# Patient Record
Sex: Male | Born: 1955 | Race: Black or African American | Hispanic: No | Marital: Married | Smoking: Never smoker
Health system: Southern US, Community
[De-identification: ages and names within clinical notes are randomized; demographics above are authoritative.]

## PROBLEM LIST (undated history)

## (undated) DIAGNOSIS — I10 Essential (primary) hypertension: Secondary | ICD-10-CM

## (undated) DIAGNOSIS — E119 Type 2 diabetes mellitus without complications: Secondary | ICD-10-CM

## (undated) DIAGNOSIS — I639 Cerebral infarction, unspecified: Secondary | ICD-10-CM

## (undated) DIAGNOSIS — IMO0001 Reserved for inherently not codable concepts without codable children: Secondary | ICD-10-CM

## (undated) DIAGNOSIS — I499 Cardiac arrhythmia, unspecified: Secondary | ICD-10-CM

## (undated) DIAGNOSIS — R112 Nausea with vomiting, unspecified: Secondary | ICD-10-CM

## (undated) DIAGNOSIS — Z531 Procedure and treatment not carried out because of patient's decision for reasons of belief and group pressure: Secondary | ICD-10-CM

## (undated) DIAGNOSIS — T8859XA Other complications of anesthesia, initial encounter: Secondary | ICD-10-CM

## (undated) DIAGNOSIS — T4145XA Adverse effect of unspecified anesthetic, initial encounter: Secondary | ICD-10-CM

## (undated) DIAGNOSIS — M1990 Unspecified osteoarthritis, unspecified site: Secondary | ICD-10-CM

## (undated) DIAGNOSIS — G4730 Sleep apnea, unspecified: Secondary | ICD-10-CM

## (undated) DIAGNOSIS — K219 Gastro-esophageal reflux disease without esophagitis: Secondary | ICD-10-CM

## (undated) DIAGNOSIS — Z9889 Other specified postprocedural states: Secondary | ICD-10-CM

## (undated) HISTORY — PX: BACK SURGERY: SHX140

---

## 1999-03-26 ENCOUNTER — Ambulatory Visit (HOSPITAL_COMMUNITY): Admission: RE | Admit: 1999-03-26 | Discharge: 1999-03-26 | Payer: Self-pay | Admitting: Orthopaedic Surgery

## 1999-04-23 ENCOUNTER — Ambulatory Visit (HOSPITAL_COMMUNITY): Admission: RE | Admit: 1999-04-23 | Discharge: 1999-04-24 | Payer: Self-pay | Admitting: Orthopaedic Surgery

## 1999-08-11 ENCOUNTER — Encounter: Admission: RE | Admit: 1999-08-11 | Discharge: 1999-11-09 | Payer: Self-pay | Admitting: Family Medicine

## 2000-01-30 ENCOUNTER — Encounter: Admission: RE | Admit: 2000-01-30 | Discharge: 2000-01-30 | Payer: Self-pay | Admitting: Family Medicine

## 2000-01-30 ENCOUNTER — Encounter: Payer: Self-pay | Admitting: Family Medicine

## 2000-02-17 ENCOUNTER — Ambulatory Visit (HOSPITAL_COMMUNITY): Admission: RE | Admit: 2000-02-17 | Discharge: 2000-02-17 | Payer: Self-pay | Admitting: Orthopaedic Surgery

## 2000-03-04 ENCOUNTER — Ambulatory Visit (HOSPITAL_COMMUNITY): Admission: RE | Admit: 2000-03-04 | Discharge: 2000-03-04 | Payer: Self-pay | Admitting: Orthopaedic Surgery

## 2000-03-19 ENCOUNTER — Ambulatory Visit (HOSPITAL_COMMUNITY): Admission: RE | Admit: 2000-03-19 | Discharge: 2000-03-19 | Payer: Self-pay | Admitting: Orthopaedic Surgery

## 2000-06-28 ENCOUNTER — Ambulatory Visit (HOSPITAL_COMMUNITY): Admission: RE | Admit: 2000-06-28 | Discharge: 2000-06-28 | Payer: Self-pay | Admitting: Neurosurgery

## 2000-06-28 ENCOUNTER — Encounter: Payer: Self-pay | Admitting: Neurosurgery

## 2000-07-13 ENCOUNTER — Encounter: Payer: Self-pay | Admitting: Neurosurgery

## 2000-07-15 ENCOUNTER — Encounter: Payer: Self-pay | Admitting: Neurosurgery

## 2000-07-15 ENCOUNTER — Inpatient Hospital Stay (HOSPITAL_COMMUNITY): Admission: RE | Admit: 2000-07-15 | Discharge: 2000-07-17 | Payer: Self-pay | Admitting: Neurosurgery

## 2001-08-01 ENCOUNTER — Encounter: Admission: RE | Admit: 2001-08-01 | Discharge: 2001-08-01 | Payer: Self-pay | Admitting: Neurosurgery

## 2001-08-01 ENCOUNTER — Encounter: Payer: Self-pay | Admitting: Neurosurgery

## 2001-10-07 ENCOUNTER — Encounter: Payer: Self-pay | Admitting: Neurosurgery

## 2001-10-11 ENCOUNTER — Encounter: Payer: Self-pay | Admitting: Neurosurgery

## 2001-10-11 ENCOUNTER — Inpatient Hospital Stay (HOSPITAL_COMMUNITY): Admission: RE | Admit: 2001-10-11 | Discharge: 2001-10-15 | Payer: Self-pay | Admitting: Neurosurgery

## 2002-09-28 ENCOUNTER — Ambulatory Visit (HOSPITAL_COMMUNITY): Admission: RE | Admit: 2002-09-28 | Discharge: 2002-09-28 | Payer: Self-pay | Admitting: *Deleted

## 2005-11-05 ENCOUNTER — Ambulatory Visit: Payer: Self-pay | Admitting: Family Medicine

## 2005-11-10 ENCOUNTER — Ambulatory Visit: Payer: Self-pay | Admitting: Family Medicine

## 2005-11-29 ENCOUNTER — Encounter: Admission: RE | Admit: 2005-11-29 | Discharge: 2005-11-29 | Payer: Self-pay | Admitting: Orthopedic Surgery

## 2006-02-02 ENCOUNTER — Emergency Department (HOSPITAL_COMMUNITY): Admission: EM | Admit: 2006-02-02 | Discharge: 2006-02-02 | Payer: Self-pay | Admitting: Emergency Medicine

## 2006-12-03 ENCOUNTER — Ambulatory Visit: Admission: RE | Admit: 2006-12-03 | Discharge: 2006-12-03 | Payer: Self-pay | Admitting: Neurosurgery

## 2007-03-11 ENCOUNTER — Ambulatory Visit: Payer: Self-pay | Admitting: Pulmonary Disease

## 2007-03-11 ENCOUNTER — Ambulatory Visit: Payer: Self-pay | Admitting: Oncology

## 2007-03-11 ENCOUNTER — Inpatient Hospital Stay (HOSPITAL_COMMUNITY): Admission: RE | Admit: 2007-03-11 | Discharge: 2007-03-20 | Payer: Self-pay | Admitting: Neurosurgery

## 2007-03-15 ENCOUNTER — Ambulatory Visit: Payer: Self-pay | Admitting: Surgery

## 2007-03-16 ENCOUNTER — Encounter (INDEPENDENT_AMBULATORY_CARE_PROVIDER_SITE_OTHER): Payer: Self-pay | Admitting: Cardiovascular Disease

## 2007-03-22 ENCOUNTER — Ambulatory Visit: Payer: Self-pay | Admitting: Oncology

## 2007-03-27 ENCOUNTER — Emergency Department (HOSPITAL_COMMUNITY): Admission: EM | Admit: 2007-03-27 | Discharge: 2007-03-27 | Payer: Self-pay | Admitting: Emergency Medicine

## 2008-05-06 ENCOUNTER — Encounter: Admission: RE | Admit: 2008-05-06 | Discharge: 2008-05-06 | Payer: Self-pay | Admitting: Internal Medicine

## 2008-05-25 HISTORY — PX: JOINT REPLACEMENT: SHX530

## 2008-06-18 ENCOUNTER — Inpatient Hospital Stay (HOSPITAL_COMMUNITY): Admission: RE | Admit: 2008-06-18 | Discharge: 2008-06-22 | Payer: Self-pay | Admitting: Orthopaedic Surgery

## 2008-08-05 IMAGING — CR DG CHEST 1V PORT
1 series · 1 of 1 positions shown · non-contrast
Comparison: 03/13/07.

CLINICAL DATA: Shortness of breath.  
 PORTABLE CHEST ? 1 VIEW:

[view not recorded]
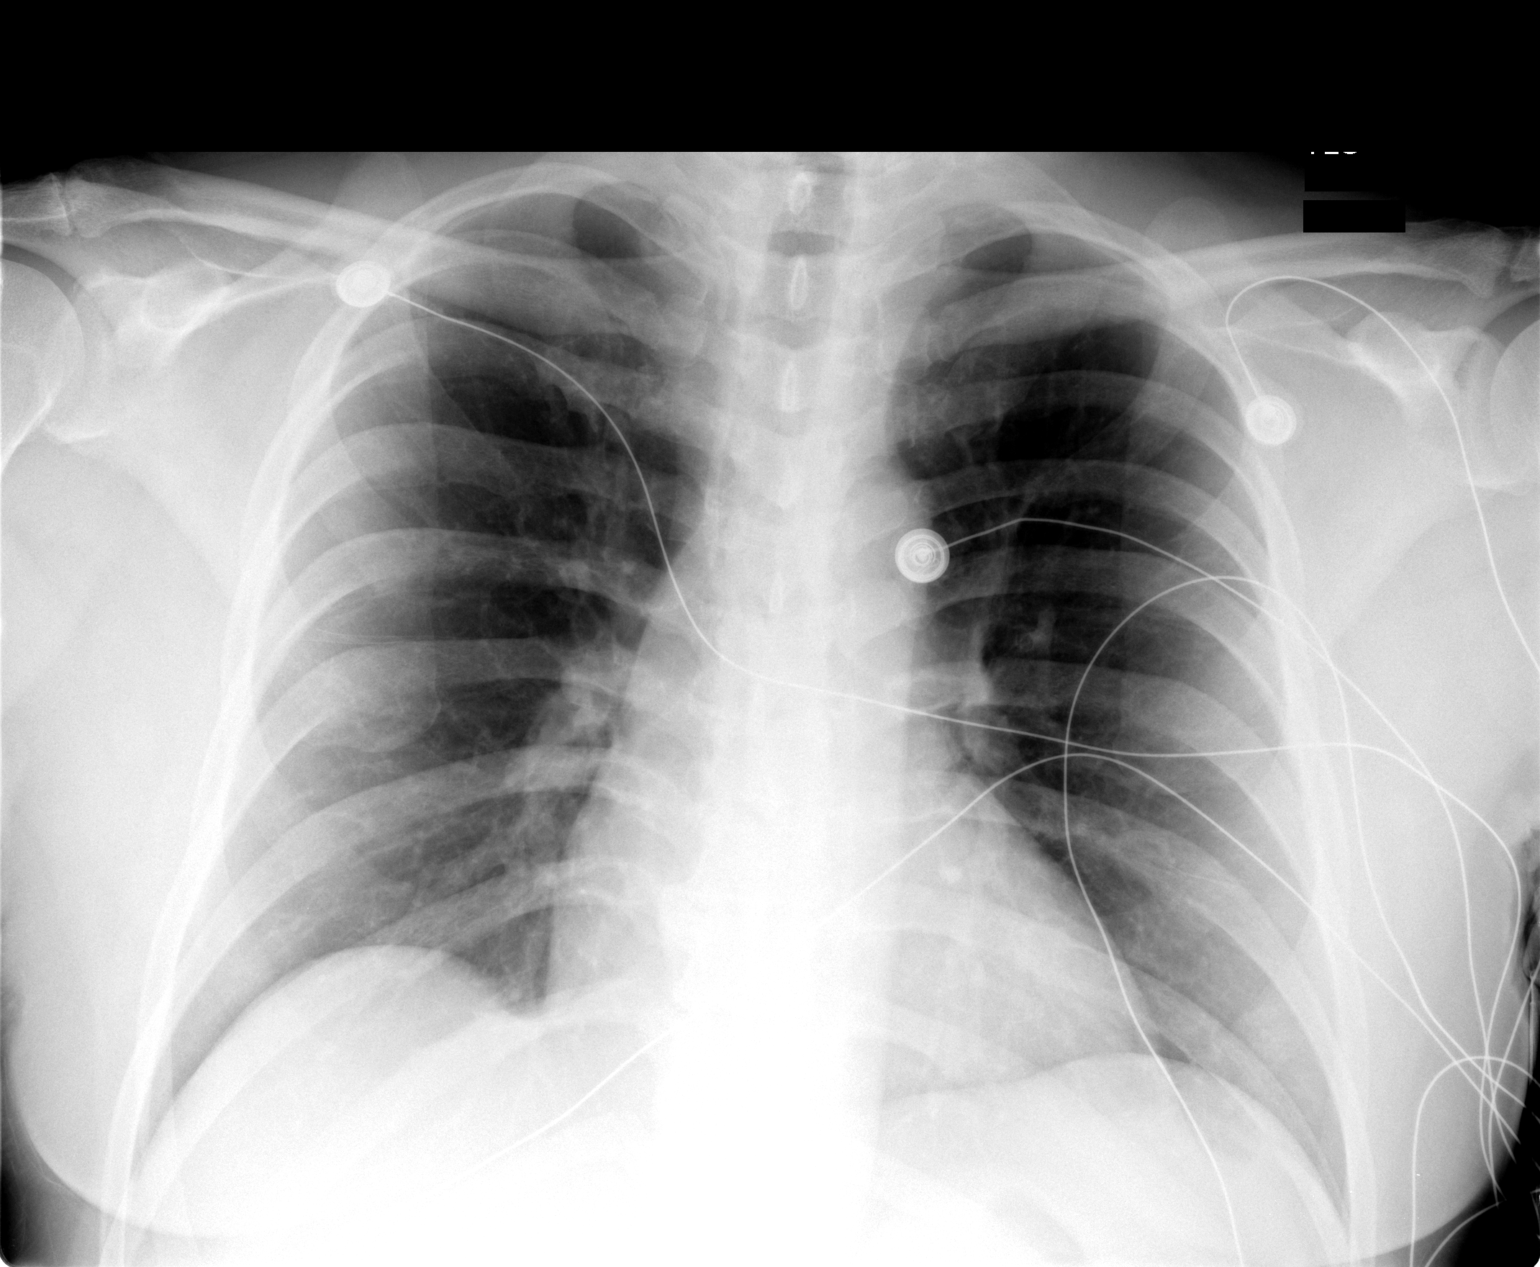

[1 of 1 positions shown; findings below may reference images not displayed]

FINDINGS: Previously seen bibasilar atelectasis has resolved.  Both lungs are well expanded and clear.  The heart and mediastinal contours are within normal limits.
IMPRESSION: No evidence of acute cardiopulmonary disease.

## 2009-10-15 ENCOUNTER — Encounter: Admission: RE | Admit: 2009-10-15 | Discharge: 2009-10-15 | Payer: Self-pay | Admitting: Neurosurgery

## 2010-09-08 LAB — BASIC METABOLIC PANEL
BUN: 12 mg/dL (ref 6–23)
BUN: 6 mg/dL (ref 6–23)
BUN: 8 mg/dL (ref 6–23)
CO2: 26 mEq/L (ref 19–32)
Calcium: 8.7 mg/dL (ref 8.4–10.5)
Calcium: 8.7 mg/dL (ref 8.4–10.5)
Calcium: 8.7 mg/dL (ref 8.4–10.5)
Creatinine, Ser: 0.96 mg/dL (ref 0.4–1.5)
GFR calc Af Amer: >60 mL/min (ref >60)
GFR calc non Af Amer: >60 mL/min (ref >60)
GFR calc non Af Amer: >60 mL/min (ref >60)
Glucose, Bld: 134 mg/dL — ABNORMAL HIGH (ref 70–99)
Glucose, Bld: 177 mg/dL — ABNORMAL HIGH (ref 70–99)
Potassium: 4.2 mEq/L (ref 3.5–5.1)
Sodium: 127 mEq/L — ABNORMAL LOW (ref 135–145)
Sodium: 133 mEq/L — ABNORMAL LOW (ref 135–145)
Sodium: 133 mEq/L — ABNORMAL LOW (ref 135–145)

## 2010-09-08 LAB — GLUCOSE, CAPILLARY
Glucose-Capillary: 101 mg/dL — ABNORMAL HIGH (ref 70–99)
Glucose-Capillary: 143 mg/dL — ABNORMAL HIGH (ref 70–99)
Glucose-Capillary: 146 mg/dL — ABNORMAL HIGH (ref 70–99)
Glucose-Capillary: 152 mg/dL — ABNORMAL HIGH (ref 70–99)
Glucose-Capillary: 165 mg/dL — ABNORMAL HIGH (ref 70–99)
Glucose-Capillary: 169 mg/dL — ABNORMAL HIGH (ref 70–99)
Glucose-Capillary: 216 mg/dL — ABNORMAL HIGH (ref 70–99)
Glucose-Capillary: 287 mg/dL — ABNORMAL HIGH (ref 70–99)

## 2010-09-08 LAB — COMPREHENSIVE METABOLIC PANEL
ALT: 25 U/L (ref 0–53)
AST: 27 U/L (ref 0–37)
Albumin: 3.1 g/dL — ABNORMAL LOW (ref 3.5–5.2)
Alkaline Phosphatase: 44 U/L (ref 39–117)
Alkaline Phosphatase: 65 U/L (ref 39–117)
BUN: 10 mg/dL (ref 6–23)
BUN: 8 mg/dL (ref 6–23)
CO2: 27 mEq/L (ref 19–32)
CO2: 28 mEq/L (ref 19–32)
Calcium: 10.5 mg/dL (ref 8.4–10.5)
Chloride: 96 mEq/L (ref 96–112)
Creatinine, Ser: 0.92 mg/dL (ref 0.4–1.5)
GFR calc Af Amer: >60 mL/min (ref >60)
Glucose, Bld: 137 mg/dL — ABNORMAL HIGH (ref 70–99)
Potassium: 4.1 mEq/L (ref 3.5–5.1)
Potassium: 4.4 mEq/L (ref 3.5–5.1)
Total Bilirubin: 0.7 mg/dL (ref 0.3–1.2)
Total Bilirubin: 1.3 mg/dL — ABNORMAL HIGH (ref 0.3–1.2)

## 2010-09-08 LAB — CBC
HCT: 25.7 % — ABNORMAL LOW (ref 39.0–52.0)
HCT: 28.5 % — ABNORMAL LOW (ref 39.0–52.0)
HCT: 36.2 % — ABNORMAL LOW (ref 39.0–52.0)
Hemoglobin: 10.1 g/dL — ABNORMAL LOW (ref 13.0–17.0)
Hemoglobin: 8.5 g/dL — ABNORMAL LOW (ref 13.0–17.0)
Platelets: 189 10*3/uL (ref 150–400)
Platelets: 216 10*3/uL (ref 150–400)
Platelets: 222 10*3/uL (ref 150–400)
Platelets: 245 10*3/uL (ref 150–400)
Platelets: 247 10*3/uL (ref 150–400)
RDW: 13.6 % (ref 11.5–15.5)
RDW: 13.8 % (ref 11.5–15.5)
RDW: 14.1 % (ref 11.5–15.5)
WBC: 10.8 10*3/uL — ABNORMAL HIGH (ref 4.0–10.5)
WBC: 9.3 10*3/uL (ref 4.0–10.5)
WBC: 9.4 10*3/uL (ref 4.0–10.5)

## 2010-09-08 LAB — DIFFERENTIAL
Eosinophils Absolute: 0.2 10*3/uL (ref 0.0–0.7)
Eosinophils Relative: 3 % (ref 0–5)
Eosinophils Relative: 4 % (ref 0–5)
Lymphocytes Relative: 15 % (ref 12–46)
Lymphs Abs: 1.4 10*3/uL (ref 0.7–4.0)
Monocytes Relative: 9 % (ref 3–12)
Neutro Abs: 3.6 10*3/uL (ref 1.7–7.7)

## 2010-09-08 LAB — URINALYSIS, ROUTINE W REFLEX MICROSCOPIC
Hgb urine dipstick: NEGATIVE
Nitrite: NEGATIVE
Protein, ur: NEGATIVE mg/dL
Specific Gravity, Urine: 1.022 (ref 1.005–1.030)

## 2010-09-08 LAB — PROTIME-INR
INR: 1 (ref 0.00–1.49)
INR: 1.1 (ref 0.00–1.49)
INR: 1.4 (ref 0.00–1.49)
Prothrombin Time: 13.7 seconds (ref 11.6–15.2)
Prothrombin Time: 14.9 seconds (ref 11.6–15.2)
Prothrombin Time: 20.7 seconds — ABNORMAL HIGH (ref 11.6–15.2)

## 2010-09-08 LAB — CK TOTAL AND CKMB (NOT AT ARMC)
CK, MB: 6.2 ng/mL — ABNORMAL HIGH (ref 0.3–4.0)
Relative Index: 0.6 (ref 0.0–2.5)
Total CK: 1019 U/L — ABNORMAL HIGH (ref 7–232)

## 2010-09-08 LAB — NO BLOOD PRODUCTS

## 2010-09-08 LAB — TROPONIN I: Troponin I: <0.01 ng/mL (ref 0.00–0.06)

## 2010-09-08 LAB — MAGNESIUM: Magnesium: 2 mg/dL (ref 1.5–2.5)

## 2010-10-07 NOTE — Op Note (Signed)
NAMEREBEKAH, [name]                  ACCOUNT NO.:  [id number]   MEDICAL RECORD NO.:  [id number]          PATIENT TYPE:  INP   LOCATION:  5007                         FACILITY:  [facility]   PHYSICIAN:  Mark C. Ophelia Charter, M.D.    DATE OF BIRTH:  02-11-1956   DATE OF PROCEDURE:  06/18/2008  DATE OF DISCHARGE:                               OPERATIVE REPORT   PREOPERATIVE DIAGNOSIS:  Right knee osteoarthritis, tricompartmental.   POSTOPERATIVE DIAGNOSIS:  Right knee osteoarthritis, tricompartmental.   PROCEDURE:  Right cemented total knee arthroplasty-computer assist.   SURGEON:  Mark C. Ophelia Charter, MD   ASSISTANT:  Wende Neighbors, PA   ANESTHESIA:  GOT plus preoperative femoral nerve block and Marcaine and  local.   TOURNIQUET TIME:  An hour and 33 minutes x350.   COMPONENTS USED:  DePuy J and J rotating platform, femoral component #5  with lugs, cemented #4 tibia with 10-mm poly-spacer rotating platform.  A 38-mm all poly-patella cemented.   After the induction of general anesthesia, proximal thigh tourniquet,  heel bump, lateral post, surgical checklist was run and it was noted  that the vancomycin was not completely in.  Ten minutes additional time  with the patient asleep with the patient's history of anaphylactic-type  reaction with penicillin, it was decided to delay the skin incision  until 80% to 90% of the vancomycin was infused since the leg was wrapped  in an Esmarch, a tourniquet would be inflated.  When about 20% of this  was left, incision was made in the midline after a sterile skin marker,  Betadine, Vi-Drape, usual total hip sheets drapes had all been applied.  Incision was made in the midline.  Meticulous hemostasis was performed.  When the deep retinaculum was encountered, only about 10% of the vanco  was left and the leg was wrapped in Esmarch and tourniquet inflated.  A  medial retinacular incision was made.  Patella flipped over was cut,  removing 10 mm of bone.   Patella was particularly thick.  There were 1-  1.5-cm osteophytes on the femur as well as the tibia as well as the  patella which were trimmed back.  There was grade 3 and grade 4  chondromalacia with exposed subchondral bone in the weightbearing  portions of the joint.  Menisci were resected, ACL was resected, and  pins were placed for computer navigation.  Computer models were  generated for the tibia and the femur.  Computer suggested #5 size which  was appropriate on the femur and 10-mm bone was resected distally using  the computer navigation.  All cuts were less than 1.5 degrees from  __________ and initially, there was suggestion on visualization that the  femur was being cut in external rotation.  The patient did have  excessive hip retroversion and had both feet turned out to about 60-70  degrees in the supine position.  With the knee flexed, posterior cut  appeared to line up well with the tibia and the old anterior reference  and guide was inserted.  It was placed on #5 and then  checked the  measured, and then re-pinned and turned out that gave the identical  pinning as the computer navigation.  With this chamfer cuts, anterior  and posterior cuts were made.  The center notch cut was made after the  tibia had been cut.  Posterior spur was removed off the femur and the  PCL was completely resected.  On the tibia, pin was taken off the high  side, 5 off below.  On the femur, Whiteside's line was used for  referencing due to the extremely large spurs and some difficulty with  the thick, broadened epicondyles on this patient's femur.  The tibia was  #4.  Initial cuts were made and then with trials inserted, it was tight,  lacking 3 degrees, reaching full extension, felt too tight, checking  balancing and flexion/extension showed that lateral side was 1-mm  tighter.  Tibia was re-cut, taking off 2 more millimeters and this gave  full extension and actually 1 degree of  hyperextension.  There was a  stable balanced collateral ligaments in both flexion and extension.  The  trials were removed after drilling the lug holes in the femur and holes  in the patella.  Pulsatile lavage cement was vacuum mixed.  Tibia  inserted and cemented first.  Femur was then inserted and all excessive  cement from the back of the knee and the gutters were removed prior to  placing the femur.  It was impacted with a large impactor and mallet all  the way down.  A 10-mm permanent poly-spacer was inserted followed by  the patella with patellar clamp.  Cement was hardened for 15 minutes.  All excessive cement had been removed.  There was good stability.  Components were down.  Computer navigation pins were removed, pins in  the tibia  were closed.  The femoral holes were made inside the  incision.  After dropping the tourniquet, hemostasis was obtained.  Layer closure with 0 and #1 nonabsorbable in the deep layer, 2-0 in the  superficial retinaculum, subcutaneous tissue, and subcuticular skin  closure.  Tincture of Benzoin, Marcaine infiltration, Steri-Strips,  postop dressing, and knee immobilizer.  The patient tolerated the  procedure well.  Knee reached full extension, was bounced in both  flexion and extension, and there was good rotation and good patellar  tracking.      Mark C. Ophelia Charter, M.D.  Electronically Signed     MCY/MEDQ  D:  06/18/2008  T:  06/19/2008  Job:  [id number]

## 2010-10-07 NOTE — Discharge Summary (Signed)
NAMEDAMONI, CAUSBY                  ACCOUNT NO.:  [id number]   MEDICAL RECORD NO.:  [id number]          PATIENT TYPE:  INP   LOCATION:  3741                         FACILITY:  [facility]   PHYSICIAN:  Danae Orleans. Venetia Maxon, M.D.  DATE OF BIRTH:  15-Jul-1955   DATE OF ADMISSION:  03/11/2007  DATE OF DISCHARGE:                               DISCHARGE SUMMARY   REASON FOR ADMISSION:  Lumbar spondylosis L2-3 and L3-4 level.   HOSPITAL COURSE:  [name] is a 55 year old man who had previously  undergone fusion at L4-5 and L5-S1.  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  He had persistent back pain and over the last few  months, since August for a knee operation, had an increase in his back  pain and pain radiating down both of his legs, left greater than right.  An MRI showed degenerative changes to L2-3 and L3-4, and he was admitted  for fusion of those levels.  He underwent C compression and fusion at L2  to L4 levels and tolerated the procedure well.  His postoperative course  was complicated by the fact that postoperatively he had some back pain  and additionally had bloody drainage from his wound.  He was  tachycardic, and he will not receive blood products.  He had hematocrit  that came down to 24.6 and subsequently had a hemoglobin of 6.7.  He was  treated with intravenous iron and also Aranesp weekly.  He was seen by  Hematology, who felt that there was not much else that could be done,  and he was evaluated by Cardiology, who felt that his anemia would  resolve over time and that there was not much else that could be done,  short of giving him blood.  Echocardiogram was performed, and the  patient gradually improved, but his PAF was appropriate given severe  anemia.  The patient is otherwise gradually had improvement in his heart  rate and was gradually mobilized, doing better.  He also is a diabetic  and had control of his blood sugars.  He was doing better on the 26th  and  was discharged in stable and satisfactory condition, and he  tolerated his operation and hospitalization.   DISCHARGE MEDICATIONS:  Included preoperative medications of NPH insulin  10 units q.h.s., Glucophage 850 mg twice daily, glipizide 10 mg 1 every  day, HCTZ 25 mg every day, lisinopril 10 mg daily, quinidine patch 0.1  mg every  7 days, Tramadol 50 mg as needed, gabapentin 300 mg 3 times  daily.   DISCHARGE INSTRUCTIONS:  He was also given a prescription for Percocet  5/325 one to 2 every 4-6 hours as needed for pain.  He is instructed to  take it easy at home, to gradually mobilize.  Wear his brace when up.   FOLLOWUP:  Follow up with Dr. Channing Mutters in 2-3 weeks.      Danae Orleans. Venetia Maxon, M.D.  Electronically Signed     JDS/MEDQ  D:  03/20/2007  T:  03/20/2007  Job:  [id number]

## 2010-10-07 NOTE — Consult Note (Signed)
NAMECESARIO, [name]                  ACCOUNT NO.:  [id number]   MEDICAL RECORD NO.:  [id number]          PATIENT TYPE:  INP   LOCATION:  3741                         FACILITY:  [facility]   PHYSICIAN:  Marlowe Kays, P.A.     DATE OF BIRTH:  03/11/1956   DATE OF CONSULTATION:  03/18/2007  DATE OF DISCHARGE:  03/20/2007                                 CONSULTATION   REFERRING PHYSICIAN:  Danae Orleans. Venetia Maxon, M.D.   CONSULTANT:  Dr. Douglass Rivers.   REASON FOR CONSULTATION:  Anemia in Jehovah's Witness patient.   HISTORY OF PRESENT ILLNESS:  We were asked to see Mr. Cho, a 55-year-  old African-American male, Jehovah's witness by Dr. Venetia Maxon for evaluation  of anemia.  The patient was previously well with no major medical  issues, but after a lumbar laminectomy of L3-L4 and L4-L5 on March 11, 2007, it was noticed that he developed significant anemia.  His  hemoglobin and hematocrit on March 07, 2007, were of 14.2 and 43  respectively.  Op note indicates that his estimated blood loss was 450  mL.  Postoperative course was complicated by paroxysmal atrial  fibrillation and a dramatic drop in his hemoglobin and hematocrit.  The  platelets dropped as well indicative of major blood loss. On March 17, 2007, his hemoglobin was 6.7.  his platelets rebounded to 326,000, after  being 130,000 on March 13, 2007.  No obvious gastrointestinal  bleeding, or melena, as per patient report.  We need to check the stools  for blood.  On March 14, 2007, his reticulocyte count was not  increased.  His vitamin B12 was 147, and a repeat B12 on March 18, 2007, shows his B12 to be 422, without apparent vitamin B12 dose given.  Iron studies are most consistent with anemia of chronic disease with low  iron and low TIBC as well as low percent saturation and a ferritin of  430, but his MCV being low, cannot exclude existing iron deficiency.  The patient was given IV iron, dextran 500 mg on March 16, 2007,  and  possibly 250 mg on March 17, 2007, before experiencing dizziness,  shortness of breath and tachycardia, in consequence the infusion being  stopped. 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  He has not been a blood donor either.  He received Aranesp  100 mcg subcu on March 16, 2007. Inspection of the peripheral smear  shows adequate platelet count, and the rest is unremarkable.  Some of  the platelets are giant, with increase RBC polychromasia and normal  shape indicating bone marrow production of platelets and red blood  cells.  Some hypochromia and rouleaux formation of uncertain  significance.  Renal function and LDH are normal.  The haptoglobin is  pending.  Based on these findings, we were asked to see Mr. Welz with  recommendations regarding his care.   PAST MEDICAL HISTORY:  1. History of paroxysmal atrial fibrillation noted on this admission,      postoperatively.  This is controlled with medications at  this time.  2. Hypertension.  3. Insulin-dependent diabetes mellitus.  4. Depression.  5. Jehovah's Witness.  6. Ejection fraction 60%.   PAST SURGICAL HISTORY:  1. Status post L4 to L5 to S1 fusion, Dr. Suzan Nailer 2003.  2. Status post knee repair, arthroscopically 2007.  3. Status post L3-L4 fusion Dr. Suzan Nailer October 2008.  4. Status post diskectomy Dr. Venetia Maxon 2001.   ALLERGIES:  PENICILLIN.   CURRENT MEDICATIONS:  1. Ecotrin 325 mg daily.  2. Cipro 500 mg b.i.d.  3. Catapres 0.1 mg q.24h.  4. Aranesp 100 mcg every Tuesday.  5. Colace 100 mg q.12h.  6. Ferrous gluconate 324 mg t.i.d.  7. Folic acid 2 mg daily.  8. Neurontin 600 mg t.i.d.  9. Glucotrol 10 mg daily.  10.HCTZ 25 mg daily.  11.B1 100 mg p.o. daily.  12.NovoLog as directed.  13.Lantus as directed.  14.Prinivil 10 mg daily.  15.Glucophage 850 mg b.i.d.  16.Lopressor 50 mg p.o. q.12h.  17.Protonix 40 mg daily.   REVIEW OF SYSTEMS:  Remarkable for night sweats,  fatigue and dyspnea on  exertion.  The rest of the review of systems is essentially negative  with no cardiac complications, GI, GU or other musculoskeletal pain.  No  colon cancer in the family.   FAMILY HISTORY:  Mother alive with osteoarthritis, possible anemia.  Father alive at 69, legally blind.  Hypertension.  One sister and one  brother both in good health.   SOCIAL HISTORY:  The patient is married.  He has 5 children in good  health.  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  No alcohol or  tobacco history.  Lives in [place], [place].  Last  colonoscopy was 3 years ago in [place], for polyps, which according to  the patient the results were negative.   PHYSICAL EXAMINATION:  GENERAL:  This is a well-developed, well-  nourished 55 year old African-American male in no acute distress, alert  and oriented times 3.  VITAL SIGNS:  Blood pressure 134, 78, pulse 110, respirations 20,  temperature 98.9, pulse oximetry 98% in room air.  Weight 115 kg, height  6 feet 3.  HEENT:  Normocephalic, atraumatic.  PERRLA.  Oral mucosa without thrush  or lesions.  No jaundice.  NECK:  Supple.  No cervical or supraclavicular lymphadenopathy.  LUNGS:  Clear to auscultation bilaterally.  CARDIOVASCULAR:  Regular rate and rhythm without murmurs, rubs or  gallops.  ABDOMEN:  Soft, nontender.  Bowel sounds times 4.  No obvious hematoma.  No palpable spleen or liver.  GENITOURINARY:  Deferred.  RECTAL:  Deferred.  EXTREMITIES:  With no clubbing or cyanosis.  No edema.  SKIN:  Remarkable for some drain out at the lumbar area, evolving  ecchymosis especially in the left midline.  No obvious hematoma.  NEUROLOGIC:  Nonfocal.   LABORATORY DATA:  Hemoglobin 6.7, hematocrit 20, white count 6.7,  platelets 326, neutrophils 3.4. MCV 80.2, PT 12.7, PTT 24, INR 0.9,  reticulocyte count 47.2.  Iron 10, TIBC 155, percent saturation 6,  ferritin 430, B12 147, folic acid 8.  Sodium 137,  potassium 3.7, BUN 10,  creatinine 1.0, glucose 111, total bilirubin 1.1, alkaline phosphatase  36, AST 39, ALT 28, total protein 5.2, albumin 2.7, calcium 8.9, A1c  7.8.   IMPRESSION:  1. Anemia secondary to blood loss, with a drop of hemoglobin from 14.2      to 6.7.  Rule out gastrointestinal bleed.  Iatrogenic phlebotomy  may be a contributing factor.  No supporting evidence for      hemolysis. Check the stools.  Use minitubes.  Avoid unnecessary      labs.  2. Were inadequate response to anemia.  We will give Aranesp.  The      patient should have received adequate IV iron for now if he was in      fact iron deficient. Iron studies are indeterminate.  3. Low vitamin 12 level on March 14, 2007, with normal repeat which      is confusing.  No support for vitamin B12 deficiency.  4. Jehovah's Witness.  At this time, we do not know of any approved      blood substitutes.  We will follow the results with you.   Thank you very much for allowing [place] the opportunity to participate in  the care of Mr. Mccalla. Once the pending results are returning, further  recommendations will proceed.      Marlowe Kays, P.A.     SW/MEDQ  D:  03/21/2007  T:  03/21/2007  Job:  [id number]

## 2010-10-07 NOTE — H&P (Signed)
[name], [name]                  ACCOUNT NO.:  [id number]   MEDICAL RECORD NO.:  [id number]          PATIENT TYPE:  INP   LOCATION:  3041                         FACILITY:  [facility]   PHYSICIAN:  Payton Doughty, M.D.      DATE OF BIRTH:  1955/08/19   DATE OF ADMISSION:  03/11/2007  DATE OF DISCHARGE:                              HISTORY & PHYSICAL   ADMISSION DIAGNOSIS:  Spondylosis L2-L3, L3-L4.   HOSPITAL COURSE:  This is a 55 year old right-handed black gentleman who  I operated on 5 years ago for fusion of L4-L5-S1.  He did well.  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  He has had persistent  back pain over the past few months since August after a knee operation.  He has had increase in his back pain as well as pain down both legs,  worse on the left than the right.  MR shows degenerative change at L2-L3  and L3-L4, and he is admitted for fusion at those levels.   MEDICAL HISTORY:  Remarkable for diabetes.   CURRENT MEDICATIONS:  1. Glipizide 10 mg a day.  2. Glucophage 850 mg twice a day.  3. Insulin 10 units a day.  4. Hydrochlorothiazide 10 mg a  day.  5. Clonidine patch weekly.  6. Aspirin.  7. Ultram 550 mg 3 times a day.   ALLERGIES:  PENICILLIN.   PAST SURGICAL HISTORY:  1. Knee arthroscopy in 2007.  2. Diskectomy by Dr. Venetia Maxon in 2001.   SOCIAL HISTORY:  He does not smoke.  Drinks socially.  Currently on  disability.   FAMILY HISTORY:  Mom 54 and frail with hypertension, back and spine  disease.  Daddy is 81 in fair health diabetes, hypertension and he is  blind.   REVIEW OF SYSTEMS:  Remarkable for back pain, leg pain, leg weakness,  hypertension, leg pain while he is  walking.  HEENT:  Within normal  limits.  He has reasonable range of motion of his neck.  CHEST:  Clear.  CARDIAC: Regular rate and rhythm.  ABDOMEN:  Nontender.  No  hepatosplenomegaly.  EXTREMITIES:  Without clubbing, cyanosis.  GU:  Deferred.  Peripheral pulses are good.   NEUROLOGICAL:  He is awake,  alert and oriented.  His cranial nerves are intact.  Motor exam shows  5/5 strength throughout the upper and lower extremities, save for  dorsiflexion in the left foot is 4/5.  A little bit of weakness in  extension of the left about 4+/5.  Sensory dysesthesia as described in  the left L2-L3 distribution.  Deep tendon reflexes are 1 at the right.  Knee flicker on the left and flicker in the ankles bilaterally.  Straight leg raise is positive for anterior leg pain on the left side.   DIAGNOSTICS:  MR demonstrates solid fusion at L4-L5-S1.  He has  spondylosis and biforaminal narrowing at L2-L3 and L3-L4, most prominent  at L3-L4.   CLINICAL IMPRESSION:  Lumbar radiculopathy related to progressive  spondylosis.   PLAN:  Lumbar laminectomy, diskectomy, posterior lumbar  fusion and  segmental pedicle screw fixation from L2-L4.  The risks and benefits of  this approach have been discussed with him and he wishes to proceed.    .           ______________________________  Payton Doughty, M.D.     MWR/MEDQ  D:  03/11/2007  T:  03/12/2007  Job:  [id number]

## 2010-10-07 NOTE — Op Note (Signed)
[name], [name]                  ACCOUNT NO.:  [id number]   MEDICAL RECORD NO.:  [id number]          PATIENT TYPE:  INP   LOCATION:  3109                         FACILITY:  [facility]   PHYSICIAN:  Payton Doughty, M.D.      DATE OF BIRTH:  08-Feb-1956   DATE OF PROCEDURE:  03/11/2007  DATE OF DISCHARGE:                               OPERATIVE REPORT   PREOPERATIVE DIAGNOSIS:  Spondylosis, L3-4, L4-5.   POSTOPERATIVE DIAGNOSIS:  Spondylosis, L3-4, L4-5.   PROCEDURE:  L3-4, L4-5 laminectomy, diskectomy, posterior lumbar  interbody fusion, Ray threaded fusion cages, posterolateral arthrodesis  with OP-1.   SURGEON:  Payton Doughty, M.D.   ANESTHESIA:  General endotracheal.   PREP:  Sterile Betadine prep and scrub with alcohol wipe.   COMPLICATIONS:  None.   ASSISTANT:  Hewitt Shorts, M.D.   DESCRIPTION OF PROCEDURE:  This is a 55 year old gentleman who has had a  fusion 5 years ago at 4-5 and 5-1.  Done well.  Has developed  spondylosis at 2-3 and 3-4 and is admitted for fusion.   Taken to operating room, smoothly anesthetized and intubated.  Placed  prone on the operating table following shave, prep, and drape in the  usual sterile fashion.   The skin was incised from mid L1 down to mid L4, and the lamina of L2,  L3, L4, and the transverse processes of L2, L3, and L4 were exposed  bilaterally in the subperiosteal plane.  Intraoperative x-ray confirmed  correctness of the level.  After having confirmed correctness of the  level, the pars interarticularis lamina and inferior facet of L2 and L3  and the superior facet of L3 and L4 were removed bilaterally using a  high-speed drill and the Kerrison.  The ligamentum flavum was removed,  and the L2, L3, L4 nerve roots were dissected free as they rounded their  respective pedicles.  At 2-3, there was more spondylosis than 3-4.  Both  had significant lateral recess narrowing.  Following complete  decompression of the nerve roots,  diskectomy was carried out at each  level bilaterally, and Ray threaded fusion cages, 12 x 26 mm, were  placed.  They were packed with bone graft harvested from the facet  joints.  The transverse processes were decorticated with a high-speed  drill and packed with BMP on Actifuse extender  putty.  Intraoperative x-ray showed good placement of cages.  Alignment  was normal.  Successive layers of 0 Vicryl, 2-0 Vicryl, and 3-0 nylon  were used to close.  Betadine and Telfa dressing was applied.   The patient returned to the recovery room in good condition.           ______________________________  Payton Doughty, M.D.     MWR/MEDQ  D:  03/11/2007  T:  03/13/2007  Job:  [id number]

## 2010-10-10 NOTE — Discharge Summary (Signed)
. [facility]  Patient:    TRUST, LEH Visit Number: [id number] MRN: [id number]          Service Type: SUR Location: 3000 [id number] Attending Physician:  Emeterio Reeve Dictated by:   Tanya Nones. Jeral Fruit, M.D. Admit Date:  10/11/2001 Discharge Date: 10/15/2001                             Discharge Summary  ADMISSION DIAGNOSIS:  L4-5, L5-S1 spondylosis.  DISCHARGE DIAGNOSIS:  L4-5, L5-S1 spondylosis.  HISTORY OF PRESENT ILLNESS:  The patient was admitted because of back pain with radiation to the legs.  The patient had surgery in the lumbar spine many years ago.  Because of the findings, he is being admitted by Payton Doughty, M.D.  LABORATORY DATA:  Normal.  HOSPITAL COURSE:  The patient was taken to surgery and L4-5, L5-S1 diskectomy followed by Ray Cages was performed.  The patient is doing really well at the present time, and he is ambulating and he is ready to go home.  CONDITION ON DISCHARGE:  Improvement.  DISCHARGE MEDICATIONS:  Percocet, diazepam.  DISCHARGE INSTRUCTIONS:  Diet:  Regular.  Activity:  Not to drive until he sees Dr. Channing Mutters. Dictated by:   Tanya Nones. Jeral Fruit, M.D. Attending Physician:  Emeterio Reeve DD:  10/15/01 TD:  10/18/01 Job: [id number]

## 2010-10-10 NOTE — Discharge Summary (Signed)
[facility]  Patient:    [name], [name]                         MRN: [id number] Adm. Date:  [id number] Disc. Date: [id number] Attending:  Josie Saunders                           Discharge Summary  ADMITTING DIAGNOSIS:  Left lumbar 4, lumbar 5 herniated disk with spondylosis.  DISCHARGE DIAGNOSIS:  Left lumbar 4, lumbar 5 herniated disk with spondylosis.  REASON FOR ADMISSION:  The patient is admitted because of back and left leg pain.  HISTORY OF PRESENT ILLNESS:  In the past this gentleman had a diskectomy at the level of 5-1 in November 2000.  The patients x-rays showed that indeed he has a herniated disk with degenerative disk disease at left L4-5.  The patient was taken to surgery on February 21st.  LABORATORY:  Within normal limits.  COURSE IN THE HOSPITAL:  The patient was taken to surgery and 4-5 diskectomy was achieved.  The patient tolerated the procedure well, is ambulating, has no headache and has no fever.  He is ready to go home.  CONDITION ON DISCHARGE:  Improving.  DISCHARGE MEDICATIONS:  Percocet ___________.  DIET:  He will continue with a diabetic diet.  ACTIVITIES:  He is not to drive for at least two weeks.  FOLLOW-UP:  To be seen by Dr. Venetia Maxon in two weeks. DD:  07/17/00 TD:  07/19/00 Job: [id number]

## 2010-10-10 NOTE — H&P (Signed)
. [facility]  Patient:    CASIN, FEDERICI                         MRN: [id number] Adm. Date:  [id number] Disc. Date: [id number] Attending:  Josie Saunders                         History and Physical  REASON FOR ADMISSION:  Herniated lumbar disk.  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 Burnis Medin, a patient of mine.  I had also previously operated on Mr. Prabhakar mother.  Mr. Goldwire previously underwent a lumbar diskectomy at L5-S1 on the left on April 23, 1999.  This was done by Dr. Loraine Leriche C. Yates.  He said he did not get better after that surgery.  He says he has had two years of low back pain prior to that surgery.  He now complains of left-sided, greater than right, lower extremity pain, also with pain into his buttocks and around the front of his knee and into his foot.  He notes tingling in his foot and to the big toe. He feels that his leg is inflamed, particularly after one to two hours of walking. He works walking a postal route, and has been able to work, but has had a great deal of pain.  He says he feels like he is getting worse.  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  He says he cannot walk in the evenings.  He notes that his right leg hurts him to his knee.  He complains of burning in his low back.  He denies any bowel or bladder dysfunction.  He has been taking Vioxx 50 mg q.d. and Flexeril for pain.  Mr. Kullman had a previous MRI of his lumbar spine prior to the surgical procedure on January 17, 1999, which shows a diffuse disk bulge at L5-S1 and bilateral foraminal stenosis at L5-S1, secondary to osteophytes, causing foraminal encroachment at the L5 nerve root.  The patient had a postoperative MRI which showed persistent nerve root compression, particularly extraforaminally at the L5-S1  level.  This was performed on January 30, 2000.  Mr. Maland is a diabetic.  He is on oral agents.  REVIEW OF SYSTEMS:  A detailed review of systems sheet was reviewed with the patient.  Pertinent positives are under eyes.  He wears glasses. CARDIOVASCULAR:  He notes high blood pressure and leg pain with walking. MUSCULOSKELETAL:  He notes leg pain and arthritis.  ENDOCRINE:  He notes diabetes mellitus.  All other systems are negative.  PAST MEDICAL HISTORY: 1. Current medical condition significant for high blood pressure. 2. Diabetes mellitus, otherwise negative.  PAST SURGICAL HISTORY:  He underwent a microdiskectomy at  L5-S1 left on April 23, 1999.  CURRENT MEDICATIONS: 1. Glucotrol XL 10 mg q.d. 2. Actos 15 mg q.d. 3. Glucophage 500 mg two tablets q.d. 4. Zestoretic 20/12.5 mg one q.d. for high blood pressure. 5. Vioxx 50 mg q.d. for back pain.  ALLERGIES:  PENICILLIN which causes his lips to swell.  HEIGHT & WEIGHT:  He is 6 feet 3-1/2 inches tall, weighing 240 pounds.  FAMILY HISTORY:  Mother is age 57 with back and leg problems.  Father is age 51 with diabetes mellitus and high blood  pressure.  SOCIAL HISTORY:  Mr. Schwertner is a social drinker of alcoholic beverages.  He is a nonsmoker.  No history of substance abuse.  He is married.  He works full-time for [employer]. [employer].  DIAGNOSTIC STUDIES:  As above.  PHYSICAL EXAMINATION:  GENERAL:  On examination today Mr. Morrell is a pleasant cooperative, uncomfortable-appearing black male in moderate discomfort.  VITAL SIGNS:  Blood pressure 130/70 left arm seated, pulse 88 and regular.  HEENT:  Normocephalic, atraumatic.  Pupils equal, round, reactive to light. Extraocular muscles intact.  Sclerae white.  Conjunctivae pink.  Oropharynx benign.  Uvula midline.  NECK:  No masses, meningismus, deformities, tracheal deviation, jugular venous distention, or carotid bruits.  There is a normal cervical range of  motion. Spurlings is negative without reproducible radicular pain on turning the patients head to either side.  Lhermittes sign is not present with axial compression.  LUNGS:  There is normal respiratory effort with good intercostal function. The lungs are clear to auscultation.  There are no rales, rhonchi, or wheezes.  CARDIOVASCULAR:  The heart is a regular rate and rhythm to auscultation.  No murmurs are appreciated.  EXTREMITIES:  There is cyanosis, clubbing, or edema.  There are palpable pedal pulses.  ABDOMEN:  Soft, nontender.  No hepatosplenomegaly appreciated or masses. There are active bowel sounds.  No guarding or rebound.  MUSCULOSKELETAL:  He has left-sided sciatic notch discomfort, mild right-sided sciatic notch discomfort, with left paraspinous discomfort, and some paravertebral spasm.  He is able to walk about the examination room with a normal heel-to-toe and casual gait, although he has an antalgic gait, favoring his left lower extremity.  He is able to bend to the level of his midcalf.  He has increased pain with extension.  He has a positive straight leg raising on the left at 30 degrees, negative on the right.  Negative Patricks test bilaterally.  NEUROLOGIC:  The patient is oriented to time, person, and place.  He has good recall of both recent and remote memory.  Normal attention span and concentration.  The patient speaks with clear and fluent speech and exhibits normal language function and appropriate fund of knowledge.  Cranial nerves: Pupils equal, round, reactive to light.  Extraocular movements are full. Visual fields are full to  confrontational testing.  Facial sensation and facial motor are intact and symmetric.  Hearing is intact to finger rub. Palate is upgoing.  Shoulder shrug is symmetrical.  Tongue protrudes in the midline.  Motor strength is 5/5 in the bilateral deltoids, biceps, triceps, hand grips, wrist extensors, interosseous.  In the  lower extremities the motor strength is 5/5 in the hip flexion, extension, quadriceps, hamstrings, plantar  flexion, right dorsiflexion, and right EHL strength, and dorsiflexion is 4/5 on the left.  Extensor hallucis longus strength is 4/5 on the left.  Sensory examination:  Decreased pin sensation throughout his entire left leg.  Deep tendon reflexes are 1 in the biceps, triceps, and brachial radialis, 2 at the right knee, trace at the left knee, 1 at the right ankle, absent at the left ankle.  Great toes are downgoing to plantar stimulation.  Cerebellar examination is normal.  Coordination in the upper and lower extremities: Normal rapid alternating movements.  Romberg test is negative.  IMPRESSION/RECOMMENDATIONS:  Mr. Ternes appears to be essentially suffering from an L5 radiculopathy on the left.  He has marked foraminal stenosis at L5-S1, which appears to be compressing the left L5 nerve root.  I  believe this is the basis for his pain, and I recommended that he undergo a lumbar myelogram and post-myelographic CT, with the expectation that he would need a fusion and pedicle screw fixation at the L5-S1 level; however, his lumbar myelogram was surprising to me in that it showed that while he does have some biforaminal stenosis at L5-S1, secondary to osteophyte formation, and clear evidence of foraminal stenosis at the L5-S1 level, there does appear to be a fairly good-sized disk herniation at the L4-5 level on the left, which appears to be causing a mass effect on the left L5 nerve root.  The soft tissue density noted in the left L5 lateral recess as well.  I told Mr. Porzio that because his symptoms are principally left-sided rather than bilateral, and that he has had clear evidence of biforaminal stenosis at the L5-S1 level, that his pain complaints may well be secondary to the L4-5 disk herniation.  He certainly could have some foraminal stenosis at the L5-S1 level on the left,  contributing to his L5 radicular symptoms, but I think the most likely cause is a soft disk herniation at the L4-5 level on the left.  I told him that I thought it was sufficiently likely that his symptoms were from the L4-5 disk herniation on the left, and that he should undergo a microdiskectomy at L4-5 on the left.  If he does not improve with that operation, then he would need to undergo a more involved operation, which would consist of a decompression and fusion with a PLIF procedure at the L4-5 and L5-S1 levels with pedicular fixation and posterior lateral arthrodesis.  I told him that this was a much more involved operation, that there is sufficiently likelihood that he would get relief with a more limited microdiskectomy, and that I would recommend doing so.  I reviewed his studies with Dr. Cristi Loron and Dr. Tanya Nones. Botero, who both agreed with that approach.  Mr. Melnyk wishes to go ahead with surgery.  I reviewed the studies with the patient and went over his physical examination.  I reviewed surgical models and discussed the typical operative course and postoperative course, and the potential risks and benefits of surgery.  The risks of surgery were discussed in detail, and include but are not limited to, the risks of anesthesia, blood loss, the possibility of hemorrhage, infection, damage to nerves, damage to blood vessels, injury to the lumbar nerve root causing either temporary or permanent leg pain, numbness, and/or weakness.  There is the potential for a spinal fluid leak from a dural tear.  There is the potential for a post-laminectomy spondylolisthesis, recurrent disk herniation quoted as approximately 10%, failure to relieve his pain, worsening of his pain, and the need for further surgery. DD:  07/15/00 TD:  07/15/00 Job: [id number]

## 2010-10-10 NOTE — H&P (Signed)
[place]. [facility]  Patient:    [name], MARINOS Visit Number: [id number] MRN: [id number]          Service Type: SUR Location: 3000 [id number] Attending Physician:  Emeterio Reeve Dictated by:   Payton Doughty, M.D. Admit Date:  10/11/2001 Discharge Date: 10/15/2001                           History and Physical  ADMITTING DIAGNOSIS: Spondylosis at L4-5 and L5-S1.  HISTORY OF PRESENT ILLNESS: This is a 55 year old right-handed black gentleman, who Dr. Venetia Maxon operated on for a lumbar disk several years ago.  He had had a prior diskectomy at L5-S1 in the past.  He has back pain and pain radiating down his legs.  He is on oral hypoglycemic agents.  PAST MEDICAL HISTORY:  1. Hypertension.  2. Diabetes.  SOCIAL HISTORY: He drinks alcohol on a social basis.  He does not smoke.  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  FAMILY HISTORY: Mom is 17, with back and leg problems.  Dad is 67, with diabetes and hypertension.  REVIEW OF SYSTEMS: Remarkable for back pain and leg pain, wearing glasses, and hypertension.  PHYSICAL EXAMINATION:  HEENT: Examination within normal limits.  NECK: He has reasonable range of motion of his neck.  CHEST: Clear.  CARDIAC: Regular rate and rhythm.  ABDOMEN: Nontender.  No hepatosplenomegaly.  EXTREMITIES: No clubbing, cyanosis, or edema.  Peripheral pulses good.  GU: Examination deferred.  NEUROLOGIC: He is awake and alert and oriented.  Cranial nerves intact.  Motor examination shows 5/5 strength throughout the upper and lower extremities.  He has some dorsiflexion weakness on the right side.  EHL is 4/5 on the left. Reflexes are absent in the lower extremities.  He has positive bilateral straight-leg-raise.  CLINICAL IMPRESSION: Severe lumbar spondylosis.  He has had discography that was positive at 5-1 and 4-5.  PLAN: He is admitted for lumbar fusion at L4-5 and L5-S1.  The risks and benefits of this approach had been  discussed with him and he wishes to proceed. Dictated by:   Payton Doughty, M.D. Attending Physician:  Emeterio Reeve DD:  10/11/01 TD:  10/11/01 Job: [id number]

## 2010-10-10 NOTE — Op Note (Signed)
[place]. [facility]  Patient:    [name]                         MRN: [id number] Proc. Date: 07/15/00 Adm. Date:  [id number] Attending:  Josie Saunders                           Operative Report  PREOPERATIVE DIAGNOSIS:  Herniated lumbar disc, L4-5 left with radiculopathy, spondylosis and degenerative disc disease.  POSTOPERATIVE DIAGNOSIS:  Herniated lumbar disc, L4-5 left with radiculopathy, spondylosis and degenerative disc disease.  There was evidence of prior laminectomy at this level.  OPERATION: Redo laminectomy L4-5 left with microdiskectomy and microdissection.  SURGEON:  Danae Orleans. Venetia Maxon, M.D.  ASSISTANT:  Stefani Dama, M.D.  ANESTHESIA:  General endotracheal  ESTIMATED BLOOD LOSS:  Minimal  COMPLICATIONS: None  DISPOSITION:  Recovery  INDICATION FOR PROCEDURE:  [name] is a 55 year old man with left L5 radiculopathy.  He had a prior lumbar laminectomy L5-S1 on the left without great relief of his pain.  This was done by another Careers adviser.  Workup of his persistent radiculopathy demonstrated a herniated disc at L4-5 level on the left and it was elected to take him to surgery for microdiskectomy.  DESCRIPTION OF PROCEDURE:  [name]. Following the satisfactory and uncomplicated induction of general endotracheal anesthesia and placement of intravenous lines, he was placed in the prone position on the Wilson frame.  His low back was prepped and draped in the usual sterile fashion.  The planned incision was infiltrated with 0.25% Marcaine and 0.5% Lidocaine, and 1:200,000 epinephrine.  Incision was made in the midline of his lumbar spine and carried through subcutaneous tissues to the lumbodorsal fascia which was incised on the left side of midline. Subperiosteal dissection was performed exposing the interspace of what was felt to be L4-5 level. Intraoperative x-ray demonstrated that this was  in fact the L3-4 level.  Subsequently exposure was then done caudally at one level. This caudal dissection was through scar tissue.  There was evidence of a prior laminectomy defect at this level and there was scar tissue overlying this old laminectomy defect.  The scar tissue was very carefully dissected free of laminae of both the superior portion of L5 and the inferior L4 lamina.  An additional was performed to confirm and confirmed this to be the L4-5 level. Using high speed drill AM8 bur equivalent with a Black Max drill the laminectomy defect was created at the L4 lamina and lateral in the medial facet at L4-5.  Using curet the scar tissue was cleared at this level and using 3 and 4 mm Kerrison rongeurs the laminectomy defect was completed.  A wide foraminotomy was performed overlying the L5 nerve root on the left.  The microscope was then brought into the field using careful microdissection technique.  The prior scar tissue was mobilized and the L5 nerve root was cleared of investing scar tissue.  There was subligamentous disc herniation at the L4-5 level on the left with a fragment below the L5 nerve root.  The fragment of disc material was carefully removed.  The disc space was then incised and disc material was removed in a piece meal fashion. Care was taken to protect the common dural tube and L5 nerve root.  There was significant amount of free fragment of disc material that was  medial in the canal.  The disc space was cleared of residual disc material.  The scar tissue was very carefully dissected from the common dural tube.  There was a small pin hole of CSF on clearing of the dura from residual scar tissue and it was elected to cover this with tissue.  At the conclusion of the case, the L5 nerve root and L4 nerve root and the foramen were all well decompressed.  There was no evidence of any residual disc material.  The tissue was placed after copiously irrigating with  Bacitracin saline.  The self retaining retractor was then removed.  Microscope was taken out of the field.  The lumbodorsal fascia was closed with 0 Vicryl suture.  Subcutaneous tissue was reapproximated with 2-0 Vicryl interrupted inverted sutures.  The skin edges were reapproximated with interrupted 3-0 Vicryl subcuticular stitch.  The wound was dressed with Benzoin and Steri-Strips, Telfa gauze and tape.  The patient was extubated in the operating Duncan and taken to the recovery Duncan in stable and satisfactory condition having tolerated his operation well.  Counts were correct at the end of the case. DD:  07/15/00 TD:  07/16/00 Job: [id number]

## 2010-10-10 NOTE — Op Note (Signed)
[place]. [facility]  Patient:    [name], [name] Visit Number: [id number] MRN: [id number]          Service Type: SUR Location: 3000 [id number] Attending Physician:  Emeterio Reeve Dictated by:   Payton Doughty, M.D. Proc. Date: 10/11/01 Admit Date:  10/11/2001                             Operative Report  PREOPERATIVE DIAGNOSIS:  Spondylosis at L4-5, L5-S1.  POSTOPERATIVE DIAGNOSIS:  Spondylosis at L4-5, L5-S1.  PROCEDURES:  L4-5, L5-S1 laminectomy, diskectomy, posterior lumbar interbody fusion with Ray Threaded Fusion Cage.  SURGEON:  Payton Doughty, M.D.  NURSE ASSISTANT:  [facility].  DOCTOR ASSISTANT:  Danae Orleans. Venetia Maxon, M.D.  ANESTHESIA:  General endotracheal.  PREPARATION:  Sterile Betadine prep and scrub with alcohol wipe.  DESCRIPTION OF PROCEDURE:  A 55 year old right-handed black gentleman with severe lumbar spondylosis at 4-5 and 5-1.  He had a previous diskectomy at 4-5 on the left.  He was taken to the operating room and smoothly anesthetized and intubated, placed prone on the operating table with pressure points padded. Following shave, prep, and drape in the usual sterile fashion, the skin was infiltrated with 1% lidocaine and 1:400,000 epinephrine.  His old skin incision was reopened and extended approximately a centimeter on each end. The fascia was divided and the laminae of L4 and L5 were identified and the 4-5 and 5-1 facet joints dissected out.  Intraoperative x-ray confirmed correctness of the level.  The pars interarticularis, lamina, and inferior facet of L4 and L5 and the superior facet of L5 and S1 were removed bilaterally.  On the left side at 4-5 there was no disk recurrence but extensive degenerative disk disease.  At 5-1 on the left side there was extensive degenerative disease with compression of the 5 root laterally and the S1 root medially.  On the right side there was severe degenerative change at 5-1, 4-5 was less  severely affected.  Following complete diskectomy and decompression of all nerve roots, the wound was irrigated and hemostasis assured.  At 5-1, 12 x 21 mm Ray Threaded Fusion Cages were placed and at 4-5, 14 x 26 mm Ray Threaded Fusion Cages were placed.  Intraoperative x-ray showed good placement of the cages.  They were packed with bone graft harvested from the facet joints and capped.  The wound was once again irrigated and hemostasis assured.  The fascia was reapproximated with 0 Vicryl in interrupted fashion, the subcutaneous tissue was reapproximated with 0 Vicryl in interrupted fashion, and the skin was closed with 3-0 nylon in a running locked fashion.  A bacitracin and Telfa dressing was applied and made occlusive with OpSite.  The patient returned to the recovery room in good condition.  COMPLICATIONS:  None. Dictated by:   Payton Doughty, M.D. Attending Physician:  Emeterio Reeve DD:  10/11/01 TD:  10/12/01 Job: [id number]

## 2010-10-10 NOTE — Discharge Summary (Signed)
[name], [name]                  ACCOUNT NO.:  [id number]   MEDICAL RECORD NO.:  [id number]          PATIENT TYPE:  INP   LOCATION:  2019                         FACILITY:  [facility]   PHYSICIAN:  Mark C. Ophelia Charter, M.D.    DATE OF BIRTH:  05-23-1956   DATE OF ADMISSION:  06/18/2008  DATE OF DISCHARGE:  06/22/2008                               DISCHARGE SUMMARY   ADMISSION DIAGNOSES:  1. Osteoarthritis of the right knee.  2. Diabetes mellitus.  3. Peripheral neuropathy.  4. Hypertension.  5. History of postoperative tachycardia in 2008 following lumbar      fusion.  6. Jehovah's Witness.   DISCHARGE DIAGNOSES:  1. Osteoarthritis of the right knee.  2. Diabetes mellitus.  3. Peripheral neuropathy.  4. Hypertension.  5. History of postoperative tachycardia in 2008 following lumbar      fusion.  6. Jehovah's Witness.  7. Acute blood loss anemia.  No transfusion secondary to the patient      being Jehovah's Witness.  8. Sinus tachycardia postoperatively with rates as high as 200      requiring Cardiology consult.   PROCEDURE:  On June 18, 2008, the patient underwent right total knee  arthroplasty performed by Dr. Ophelia Charter, assisted by Maud Deed, PA-C  under general anesthesia.   CONSULTATION:  Dr. Garen Lah of Cardiology.   BRIEF HISTORY:  The patient is a 55 year old male who is status post  right knee arthroscopy.  He has continued to have pain in the right knee  which now keeps him from resting comfortably at night.  He has undergone  intraarticular injections as well as viscous supplementation without  relief of his symptoms.  He now has painful range of motion of the right  knee and has difficulty ambulating.  Radiographs have shown valgus  deformity and end-stage tricompartmental changes.  It was felt he would  benefit from surgical intervention and was admitted for the procedure as  stated above.   BRIEF HOSPITAL COURSE:  The patient tolerated the procedure under  general anesthesia without complications.  Postoperatively, he was  placed on Coumadin for DVT prophylaxis.  Due to his status of Jehovah's  Witness and not requiring blood transfusion, his protimes were monitored  closely to avoid overshooting and causing increased blood loss.  Pharmacy monitored the protimes and made adjustments in his Coumadin  dose accordingly.  The patient did have a drop in his hemoglobin and  hematocrit to 8.5 and 25.7.  On January 28, his Coumadin was  discontinued to avoid further anemia.  He was treated for DVT  prophylaxis with PAS hose and mobilization.  He also was given bilateral  knee high TED hose.  Pain was initially controlled with PCA analgesics.  He was gradually weaned to p.o. analgesics.  He developed tachycardia on  the second postoperative day.  Initially, he was asymptomatic and had  known history of tachycardia, but did not have any followup from his  last episode which was in 2008.  A Cardiology consult was obtained and  he was placed on the telemetry floor.  Electrolytes  were replenished and  he was started on a beta-blocker.  For the next couple of days, he  continued to have elevations in his heart rate to 150 and as high as  200.  He was followed by Dr. Garen Lah and associates with adjustments in  medications made until eventually his heart rate was stable at less than  150.  During the timeframe of being on the telemetry floor, he did  continue his physical therapy.  He received treatment with CPM machine  for passive range of motion.  He also received active range of motion  exercises of the right knee, stretching and strengthening exercises.  He  was allowed weightbearing as tolerated. He was able to ambulate as much  as 400 feet with a rolling walker prior to discharge.  Dressing changes  were done daily and his wound was healing without signs of infection.  On June 22, 2008, he was stable to be discharged to his home after   arrangements made for home health physical therapy, occupational  therapy, and durable medical equipment.   PERTINENT LABORATORY VALUES:  On admission, hemoglobin and hematocrit  16.3 and 49.7 respectively.  At discharge, hemoglobin 8.2 and hematocrit  24.7.  INR on admission was normal.  As of June 21, 2008, INR was 1.7  and at that point the patient's Coumadin was discontinued.  The patient  developed hyponatremia as low as 127.  He did receive IV fluids with  electrolyte replacement.  Blood sugars were monitored daily and he was  on sliding scale insulin.  Hemoglobin A1c was 7.0.  The patient was  noted to have low total protein at 5.9, albumin 3.1, and total bilirubin  1.3.  Cardiac markers showed normal troponin levels.  Urinalysis on  admission negative for urinary tract infection.  Echocardiogram from  March 16, 2007, was utilized by the cardiologist for evaluation of the  patient and is placed in the records for this admission as well.  EKG on  January 21, showed normal sinus rhythm compared to November 2008, rate  has slowed; repeat on January 27, showed supraventricular tachycardia,  left ventricular hypertrophy with repolarization abnormality and repeat  on January 28, showed normal sinus rhythm, left atrial enlargement, ST  elevation, consider early repolarization, normal sinus rhythm has  replaced SVT from previous tracing.   PLAN:  The patient was discharged to his home.  Arrangements made for  home health physical therapy and occupational therapy.  There, he will  continue to do range of motion exercises as well as stretching and  strengthening exercises.  He will ambulate with a walker and  weightbearing as tolerated.  Dressing to be changed daily at home or as  needed.  He will be allowed to shower.  The patient will continue to  wear his TED stockings for DVT prophylaxis.  He will follow up with Dr.  Ophelia Charter in 2 weeks from surgery.  He will call to arrange this   appointment.  His medication reconciliation form was reviewed with him.  He was instructed to continue his home medications as taken prior to  admission with the exception of tramadol, hydrochlorothiazide, and  clonidine patch.  He was given prescriptions for Cardizem CD 120 mg  daily and Lopressor 50 mg one and a half tablet twice daily.  Also Tylox  1-2 every 4-6 hours as needed for pain, ferrous sulfate 300 mg p.o.  b.i.d. and his hydrochlorothiazide was redosed to 12.5 mg daily.  The  patient  will follow up with Dr. Garen Lah at [facility] on  July 04, 2008, at 2:50 p.m.  He may call their office at [phone]  with further questions.  He was advised to call the office should he  develop any heart racing.  He will plan on having an auto-event monitor  placed at [facility].  The patient will call the office if there  are questions or concerns regarding his orthopedic care.   CONDITION ON DISCHARGE:  Stable.      Wende Neighbors, P.A.      Mark C. Ophelia Charter, M.D.  Electronically Signed    SMV/MEDQ  D:  07/19/2008  T:  07/19/2008  Job:  [id number]

## 2011-03-03 LAB — BASIC METABOLIC PANEL
BUN: 15
CO2: 29
Chloride: 101
Creatinine, Ser: 0.88
Potassium: 4.4

## 2011-03-03 LAB — DIFFERENTIAL
Basophils Absolute: 0
Basophils Relative: 0
Lymphs Abs: 1.1
Monocytes Relative: 5
Neutro Abs: 5.8

## 2011-03-03 LAB — CBC
HCT: 32.7 — ABNORMAL LOW
MCHC: 32.4
MCV: 79.8
Platelets: 947
RBC: 4.1 — ABNORMAL LOW

## 2011-03-04 LAB — BASIC METABOLIC PANEL
BUN: 10
BUN: 11
BUN: 9
CO2: 27
CO2: 28
Calcium: 7.7 — ABNORMAL LOW
Calcium: 7.7 — ABNORMAL LOW
Chloride: 93 — ABNORMAL LOW
Chloride: 94 — ABNORMAL LOW
Chloride: 97
Chloride: 99
Creatinine, Ser: 1
Creatinine, Ser: 1.13
Creatinine, Ser: 1.21
GFR calc Af Amer: >60
GFR calc Af Amer: >60
GFR calc Af Amer: >60
GFR calc non Af Amer: >60
GFR calc non Af Amer: >60
GFR calc non Af Amer: >60
GFR calc non Af Amer: >60
GFR calc non Af Amer: >60
Glucose, Bld: 115 — ABNORMAL HIGH
Glucose, Bld: 155 — ABNORMAL HIGH
Potassium: 3.3 — ABNORMAL LOW
Potassium: 3.6
Potassium: 3.7
Potassium: 3.8
Sodium: 127 — ABNORMAL LOW
Sodium: 129 — ABNORMAL LOW
Sodium: 130 — ABNORMAL LOW
Sodium: 137

## 2011-03-04 LAB — CBC
HCT: 19.8 — ABNORMAL LOW
HCT: 20 — ABNORMAL LOW
HCT: 20.2 — ABNORMAL LOW
HCT: 20.7 — ABNORMAL LOW
HCT: 30.5 — ABNORMAL LOW
Hemoglobin: 6.7 — CL
Hemoglobin: 6.7 — CL
Hemoglobin: 7 — CL
Hemoglobin: 8.2 — ABNORMAL LOW
MCHC: 33
MCHC: 33.3
MCHC: 33.8
MCV: 80
MCV: 80.2
MCV: 80.2
MCV: 80.7
Platelets: 130 — ABNORMAL LOW
Platelets: 136 — ABNORMAL LOW
Platelets: 138 — ABNORMAL LOW
Platelets: 144 — ABNORMAL LOW
Platelets: 217
Platelets: 260
RBC: 2.47 — ABNORMAL LOW
RBC: 2.49 — ABNORMAL LOW
RBC: 2.52 — ABNORMAL LOW
RBC: 2.58 — ABNORMAL LOW
RDW: 13.4
RDW: 13.5
RDW: 13.6
WBC: 10.6 — ABNORMAL HIGH
WBC: 6.7
WBC: 6.8
WBC: 7.1
WBC: 9

## 2011-03-04 LAB — TSH: TSH: 1.954

## 2011-03-04 LAB — COMPREHENSIVE METABOLIC PANEL
ALT: 28
AST: 39 — ABNORMAL HIGH
Albumin: 2.7 — ABNORMAL LOW
Alkaline Phosphatase: 36 — ABNORMAL LOW
BUN: 12
Chloride: 97
GFR calc Af Amer: >60
Potassium: 3.9
Sodium: 134 — ABNORMAL LOW
Total Bilirubin: 1.1
Total Protein: 5.2 — ABNORMAL LOW

## 2011-03-04 LAB — CARDIAC PANEL(CRET KIN+CKTOT+MB+TROPI)
CK, MB: 2.6
CK, MB: 3.9
Relative Index: 0.3
Total CK: 946 — ABNORMAL HIGH
Troponin I: 0.03

## 2011-03-04 LAB — RETICULOCYTES
RBC.: 2.95 — ABNORMAL LOW
Retic Count, Absolute: 47.2

## 2011-03-04 LAB — HEMOGLOBIN A1C: Mean Plasma Glucose: 200

## 2011-03-04 LAB — SAVE SMEAR

## 2011-03-04 LAB — LACTATE DEHYDROGENASE: LDH: 197

## 2011-03-04 LAB — CORTISOL: Cortisol, Plasma: 11.1

## 2011-03-04 LAB — HAPTOGLOBIN: Haptoglobin: 397 — ABNORMAL HIGH

## 2011-03-04 LAB — OCCULT BLOOD X 1 CARD TO LAB, STOOL: Fecal Occult Bld: NEGATIVE

## 2011-03-04 LAB — MAGNESIUM: Magnesium: 2.2

## 2011-03-04 LAB — IRON AND TIBC
Iron: 10 — ABNORMAL LOW
TIBC: 155 — ABNORMAL LOW

## 2011-03-04 LAB — FOLATE RBC: RBC Folate: 669 — ABNORMAL HIGH

## 2011-03-04 LAB — OSMOLALITY, URINE: Osmolality, Ur: 430

## 2011-03-05 LAB — CBC
HCT: 43
Platelets: 256
RDW: 13.7
WBC: 5.5

## 2011-03-05 LAB — DIFFERENTIAL
Basophils Relative: 1
Eosinophils Relative: 4
Monocytes Absolute: 0.5
Monocytes Relative: 8
Neutro Abs: 3.4

## 2011-03-05 LAB — COMPREHENSIVE METABOLIC PANEL
AST: 35
Albumin: 4.3
Alkaline Phosphatase: 54
BUN: 11
GFR calc Af Amer: >60
Potassium: 4.2
Sodium: 139
Total Protein: 7.5

## 2011-03-05 LAB — URINALYSIS, ROUTINE W REFLEX MICROSCOPIC
Bilirubin Urine: NEGATIVE
Glucose, UA: NEGATIVE
Nitrite: NEGATIVE
Specific Gravity, Urine: 1.016
pH: 5.5

## 2011-03-05 LAB — APTT: aPTT: 24

## 2011-03-10 LAB — COMPREHENSIVE METABOLIC PANEL
ALT: 35
AST: 30
Alkaline Phosphatase: 53
CO2: 27
Chloride: 104
GFR calc Af Amer: >60
GFR calc non Af Amer: >60
Sodium: 138
Total Bilirubin: 0.8

## 2011-03-10 LAB — CBC
RBC: 5.42
WBC: 5.4

## 2011-03-10 LAB — URINALYSIS, ROUTINE W REFLEX MICROSCOPIC
Glucose, UA: NEGATIVE
Hgb urine dipstick: NEGATIVE
Protein, ur: NEGATIVE
Specific Gravity, Urine: 1.019
pH: 6

## 2011-03-10 LAB — NO BLOOD PRODUCTS

## 2011-03-10 LAB — DIFFERENTIAL
Eosinophils Absolute: 0.2
Eosinophils Relative: 3
Lymphs Abs: 1.4
Monocytes Absolute: 0.4

## 2011-03-10 LAB — PROTIME-INR: Prothrombin Time: 13.9

## 2013-05-25 HISTORY — PX: OTHER SURGICAL HISTORY: SHX169

## 2014-12-04 ENCOUNTER — Other Ambulatory Visit (HOSPITAL_COMMUNITY): Payer: Self-pay | Admitting: Orthopaedic Surgery

## 2014-12-14 ENCOUNTER — Other Ambulatory Visit (HOSPITAL_COMMUNITY): Payer: Self-pay | Admitting: Orthopaedic Surgery

## 2014-12-14 ENCOUNTER — Encounter (HOSPITAL_COMMUNITY): Payer: Self-pay

## 2014-12-14 ENCOUNTER — Encounter (HOSPITAL_COMMUNITY)
Admission: RE | Admit: 2014-12-14 | Discharge: 2014-12-14 | Disposition: A | Discharge status: Home / Self Care | Payer: No Typology Code available for payment source | Source: Ambulatory Visit | Attending: Orthopaedic Surgery | Admitting: Orthopaedic Surgery

## 2014-12-14 ENCOUNTER — Ambulatory Visit (HOSPITAL_COMMUNITY)
Admission: RE | Admit: 2014-12-14 | Discharge: 2014-12-14 | Disposition: A | Discharge status: Home / Self Care | Payer: No Typology Code available for payment source | Source: Ambulatory Visit | Attending: Orthopaedic Surgery | Admitting: Orthopaedic Surgery

## 2014-12-14 DIAGNOSIS — M1710 Unilateral primary osteoarthritis, unspecified knee: Secondary | ICD-10-CM

## 2014-12-14 DIAGNOSIS — Z01812 Encounter for preprocedural laboratory examination: Secondary | ICD-10-CM | POA: Diagnosis not present

## 2014-12-14 DIAGNOSIS — Z01818 Encounter for other preprocedural examination: Secondary | ICD-10-CM | POA: Insufficient documentation

## 2014-12-14 DIAGNOSIS — M179 Osteoarthritis of knee, unspecified: Secondary | ICD-10-CM

## 2014-12-14 DIAGNOSIS — I517 Cardiomegaly: Secondary | ICD-10-CM | POA: Diagnosis not present

## 2014-12-14 DIAGNOSIS — Z01810 Encounter for preprocedural cardiovascular examination: Secondary | ICD-10-CM | POA: Insufficient documentation

## 2014-12-14 HISTORY — DX: Sleep apnea, unspecified: G47.30

## 2014-12-14 HISTORY — DX: Type 2 diabetes mellitus without complications: E11.9

## 2014-12-14 HISTORY — DX: Cardiac arrhythmia, unspecified: I49.9

## 2014-12-14 HISTORY — DX: Essential (primary) hypertension: I10

## 2014-12-14 HISTORY — DX: Gastro-esophageal reflux disease without esophagitis: K21.9

## 2014-12-14 HISTORY — DX: Unspecified osteoarthritis, unspecified site: M19.90

## 2014-12-14 HISTORY — DX: Adverse effect of unspecified anesthetic, initial encounter: T41.45XA

## 2014-12-14 HISTORY — DX: Other complications of anesthesia, initial encounter: T88.59XA

## 2014-12-14 LAB — COMPREHENSIVE METABOLIC PANEL
ALK PHOS: 61 U/L (ref 38–126)
ALT: 20 U/L (ref 17–63)
ANION GAP: 7 (ref 5–15)
AST: 23 U/L (ref 15–41)
Albumin: 4.4 g/dL (ref 3.5–5.0)
BILIRUBIN TOTAL: 1.1 mg/dL (ref 0.3–1.2)
BUN: 12 mg/dL (ref 6–20)
CALCIUM: 9.7 mg/dL (ref 8.9–10.3)
CHLORIDE: 105 mmol/L (ref 101–111)
CO2: 26 mmol/L (ref 22–32)
Creatinine, Ser: 1.11 mg/dL (ref 0.61–1.24)
GFR calc non Af Amer: >60 mL/min (ref >60)
Glucose, Bld: 52 mg/dL — ABNORMAL LOW (ref 65–99)
Potassium: 4.3 mmol/L (ref 3.5–5.1)
SODIUM: 138 mmol/L (ref 135–145)
Total Protein: 7.7 g/dL (ref 6.5–8.1)

## 2014-12-14 LAB — GLUCOSE, CAPILLARY: GLUCOSE-CAPILLARY: 77 mg/dL (ref 65–99)

## 2014-12-14 LAB — SURGICAL PCR SCREEN
MRSA, PCR: NEGATIVE
Staphylococcus aureus: NEGATIVE

## 2014-12-14 LAB — CBC
HCT: 48.4 % (ref 39.0–52.0)
HEMOGLOBIN: 15.9 g/dL (ref 13.0–17.0)
MCH: 26.8 pg (ref 26.0–34.0)
MCHC: 32.9 g/dL (ref 30.0–36.0)
MCV: 81.5 fL (ref 78.0–100.0)
PLATELETS: 196 10*3/uL (ref 150–400)
RBC: 5.94 MIL/uL — AB (ref 4.22–5.81)
RDW: 13.5 % (ref 11.5–15.5)
WBC: 6.2 10*3/uL (ref 4.0–10.5)

## 2014-12-14 LAB — URINALYSIS, ROUTINE W REFLEX MICROSCOPIC
GLUCOSE, UA: NEGATIVE mg/dL
HGB URINE DIPSTICK: NEGATIVE
KETONES UR: 15 mg/dL — AB
LEUKOCYTES UA: NEGATIVE
Nitrite: NEGATIVE
PH: 5.5 (ref 5.0–8.0)
Protein, ur: NEGATIVE mg/dL
Specific Gravity, Urine: 1.026 (ref 1.005–1.030)
Urobilinogen, UA: 1 mg/dL (ref 0.0–1.0)

## 2014-12-14 LAB — NO BLOOD PRODUCTS

## 2014-12-14 LAB — PROTIME-INR
INR: 1.14 (ref 0.00–1.49)
PROTHROMBIN TIME: 14.8 s (ref 11.6–15.2)

## 2014-12-14 LAB — APTT: aPTT: 26 seconds (ref 24–37)

## 2014-12-14 NOTE — Pre-Procedure Instructions (Addendum)
[name]  12/14/2014     No Pharmacies Listed   Your procedure is scheduled on 12/24/14.  Report to [facility] cone short stay admitting at 1030 A.M.  Call this number if you have problems the morning of surgery:  [phone]   Remember:  Do not eat food or drink liquids after midnight.  Take these medicines the morning of surgery with A SIP OF WATER diltiazem, gabapentin, metoprolol,omeprazole   STOP all herbel meds, nsaids (aleve,naproxen,advil,ibuprofen) 5 days prior to surgery starting 12/19/14 including vitamins, aspirin, fish oil     No insulin or diabetic med am of surgery   Do not wear jewelry, make-up or nail polish.  Do not wear lotions, powders, or perfumes.  You may wear deodorant.  Do not shave 48 hours prior to surgery.  Men may shave face and neck.  Do not bring valuables to the hospital.  [facility] is not responsible for any belongings or valuables.  Contacts, dentures or bridgework may not be worn into surgery.  Leave your suitcase in the car.  After surgery it may be brought to your room.  For patients admitted to the hospital, discharge time will be determined by your treatment team.  Patients discharged the day of surgery will not be allowed to drive home.   Name and phone number of your driver:    Special instructions:   Special Instructions: [place] - Preparing for Surgery  Before surgery, you can play an important role.  Because skin is not sterile, your skin needs to be as free of germs as possible.  You can reduce the number of germs on you skin by washing with CHG (chlorahexidine gluconate) soap before surgery.  CHG is an antiseptic cleaner which kills germs and bonds with the skin to continue killing germs even after washing.  Please DO NOT use if you have an allergy to CHG or antibacterial soaps.  If your skin becomes reddened/irritated stop using the CHG and inform your nurse when you arrive at Short Stay.  Do not shave (including legs and  underarms) for at least 48 hours prior to the first CHG shower.  You may shave your face.  Please follow these instructions carefully:   1.  Shower with CHG Soap the night before surgery and the morning of Surgery.  2.  If you choose to wash your hair, wash your hair first as usual with your normal shampoo.  3.  After you shampoo, rinse your hair and body thoroughly to remove the Shampoo.  4.  Use CHG as you would any other liquid soap.  You can apply chg directly  to the skin and wash gently with scrungie or a clean washcloth.  5.  Apply the CHG Soap to your body ONLY FROM THE NECK DOWN.  Do not use on open wounds or open sores.  Avoid contact with your eyes ears, mouth and genitals (private parts).  Wash genitals (private parts)       with your normal soap.  6.  Wash thoroughly, paying special attention to the area where your surgery will be performed.  7.  Thoroughly rinse your body with warm water from the neck down.  8.  DO NOT shower/wash with your normal soap after using and rinsing off the CHG Soap.  9.  Pat yourself dry with a clean towel.            10.  Wear clean pajamas.  11.  Place clean sheets on your bed the night of your first shower and do not sleep with pets.  Day of Surgery  Do not apply any lotions/deodorants the morning of surgery.  Please wear clean clothes to the hospital/surgery center.  Please read over the following fact sheets that you were given. Pain Booklet, Coughing and Deep Breathing, MRSA Information and Surgical Site Infection Prevention

## 2014-12-14 NOTE — Progress Notes (Signed)
Office called re: allergy to pcn- tongue swells/   To d/c and order new rx. Dr Ophelia Charter will sign when returns next week.   req'd [facility] notes[facility]  . Visit on 7/16

## 2014-12-15 LAB — HEMOGLOBIN A1C
Hgb A1c MFr Bld: 6.9 % — ABNORMAL HIGH (ref 4.8–5.6)
Mean Plasma Glucose: 151 mg/dL

## 2014-12-17 ENCOUNTER — Encounter (HOSPITAL_COMMUNITY): Payer: Self-pay

## 2014-12-17 NOTE — Progress Notes (Signed)
Anesthesia Chart Review: Patient is a 59 year old male scheduled for left TKA on 12/24/14 by Dr. Ophelia Charter.  History includes non-smoker, DM2, GERD, HTN, afib, OSA with CPAP, back surgery X 4, right TKA '10. For anesthesia history, he reported post-operative "tachycardia."   PCP is listed as Dr. Dorene Grebe with the [facility]. He has cardiac clearance from Dr. Alric Quan Gomadam/Dr. Griselda Miner following a 12/11/14 visit. They did not recommend further cardiac testing prior to TKR. Dr. Alric Quan did recommend continuing metoprolol, diltiazem, and ASA perioperatively. Patient was felt low to intermediate risk. (I notified Cheryl at Dr. Ophelia Charter' office regarding cardiology recommendations. She will have Dr. Ophelia Charter' review for patient notification of perioperative ASA instructions.)  Records from the [facility] received.  He initially was seen by cardiologist Dr. Rowe Clack at the [facility] in 11/2008 after developing PSVT post-TKR in the setting of anemia (HGB 7). In his review of records, he did not see clear evidence of afib/flutter and Holter monitor was negative, so he did not recommended further cardiac work-up at that time. If 10/2013, he was found to be in new on-set afib by Dr. Ena Dawley. He was seen again by Dr. Lyna Poser. Patient declined anticoagulation therapy with warfarin due to his practice as a Jehovah's Witness and concern for bleeding. He opted for ASA 325 mg in hopes to decrease CVA risk. CHA2DS2-VASc=2.  Meds include ASA, diltiazem, glipizide, insulin NPH, metformin, metoprolol, fish oil, omeprazole.   12/14/14 EKG: Afib at 67 bpm.    06/16/12 Nuclear stress test [facility]): Negative cardiac SPECT. No perfusion defects identified to suggest ischemia or infarction. Gated images are without evidence of hypokinesis or akinesis. Calculated EF 57% with the calculated end diastolic volume measuring 122 mL.  According to [facility] cardiology records, Echo 12/2013 showed: Mild concentric LVH.  Preserved LV systolic function. EF 50-55% (visual estimation). Diastolic function not well evaluated due to arrhythmias. LA is mildly dilated. Mild aortic sclerosis. No hemodynamically significant valvular aortic stenosis. Mild mitral regurgitation. Trace tricuspid regurgitation. RVSP is not well evaluated.  12/14/14 CXR: IMPRESSION: No active disease. Borderline cardiomegaly.  Preoperative labs noted. Glucose 52 at PAT. A1C 6.9. 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   Patient with known chronic afib. HR was controlled at PAT.  He is to take diltiazem and metoprolol on the morning of surgery. H/H is WNL.  If no acute changes then I anticipate that he can proceed as planned.  Velna Ochs [facility] Short Stay Center/Anesthesiology Phone [phone] 12/17/2014 4:57 PM

## 2014-12-21 ENCOUNTER — Other Ambulatory Visit (HOSPITAL_COMMUNITY): Payer: Self-pay | Admitting: Orthopaedic Surgery

## 2014-12-21 MED ORDER — CLINDAMYCIN PHOSPHATE 900 MG/50ML IV SOLN
900.0000 mg | INTRAVENOUS | Status: AC
  Administered 2014-12-24: 900 mg via INTRAVENOUS
  Filled 2014-12-21: qty 50

## 2014-12-24 ENCOUNTER — Inpatient Hospital Stay (HOSPITAL_COMMUNITY): Payer: Non-veteran care | Admitting: Vascular Surgery

## 2014-12-24 ENCOUNTER — Inpatient Hospital Stay (HOSPITAL_COMMUNITY): Payer: Non-veteran care | Admitting: Certified Registered"

## 2014-12-24 ENCOUNTER — Encounter (HOSPITAL_COMMUNITY)
Admission: RE | Disposition: A | Discharge status: Home Health | Payer: Self-pay | Source: Ambulatory Visit | Attending: Orthopaedic Surgery

## 2014-12-24 ENCOUNTER — Inpatient Hospital Stay (HOSPITAL_COMMUNITY)
Admission: RE | Admit: 2014-12-24 | Discharge: 2014-12-27 | DRG: 470 | Disposition: A | Discharge status: Home Health | Payer: Non-veteran care | Source: Ambulatory Visit | Attending: Orthopaedic Surgery | Admitting: Orthopaedic Surgery

## 2014-12-24 DIAGNOSIS — Z96651 Presence of right artificial knee joint: Secondary | ICD-10-CM | POA: Diagnosis present

## 2014-12-24 DIAGNOSIS — Z981 Arthrodesis status: Secondary | ICD-10-CM | POA: Diagnosis not present

## 2014-12-24 DIAGNOSIS — I10 Essential (primary) hypertension: Secondary | ICD-10-CM | POA: Diagnosis present

## 2014-12-24 DIAGNOSIS — M1712 Unilateral primary osteoarthritis, left knee: Secondary | ICD-10-CM | POA: Diagnosis present

## 2014-12-24 DIAGNOSIS — I4891 Unspecified atrial fibrillation: Secondary | ICD-10-CM | POA: Diagnosis present

## 2014-12-24 DIAGNOSIS — Z7982 Long term (current) use of aspirin: Secondary | ICD-10-CM

## 2014-12-24 DIAGNOSIS — Z79899 Other long term (current) drug therapy: Secondary | ICD-10-CM | POA: Diagnosis not present

## 2014-12-24 DIAGNOSIS — G4730 Sleep apnea, unspecified: Secondary | ICD-10-CM | POA: Diagnosis present

## 2014-12-24 DIAGNOSIS — Z96659 Presence of unspecified artificial knee joint: Secondary | ICD-10-CM

## 2014-12-24 DIAGNOSIS — E119 Type 2 diabetes mellitus without complications: Secondary | ICD-10-CM | POA: Diagnosis present

## 2014-12-24 DIAGNOSIS — M25562 Pain in left knee: Secondary | ICD-10-CM | POA: Diagnosis present

## 2014-12-24 DIAGNOSIS — Z794 Long term (current) use of insulin: Secondary | ICD-10-CM | POA: Diagnosis not present

## 2014-12-24 DIAGNOSIS — K219 Gastro-esophageal reflux disease without esophagitis: Secondary | ICD-10-CM | POA: Diagnosis present

## 2014-12-24 HISTORY — PX: KNEE ARTHROPLASTY: SHX992

## 2014-12-24 HISTORY — PX: TOTAL KNEE ARTHROPLASTY: SHX125

## 2014-12-24 HISTORY — DX: Other specified postprocedural states: R11.2

## 2014-12-24 HISTORY — DX: Reserved for inherently not codable concepts without codable children: IMO0001

## 2014-12-24 HISTORY — DX: Procedure and treatment not carried out because of patient's decision for reasons of belief and group pressure: Z53.1

## 2014-12-24 HISTORY — DX: Other specified postprocedural states: Z98.890

## 2014-12-24 LAB — GLUCOSE, CAPILLARY
GLUCOSE-CAPILLARY: 67 mg/dL (ref 65–99)
GLUCOSE-CAPILLARY: 95 mg/dL (ref 65–99)
Glucose-Capillary: 121 mg/dL — ABNORMAL HIGH (ref 65–99)
Glucose-Capillary: 91 mg/dL (ref 65–99)
Glucose-Capillary: 79 mg/dL (ref 65–99)
Glucose-Capillary: 250 mg/dL — ABNORMAL HIGH (ref 65–99)

## 2014-12-24 SURGERY — ARTHROPLASTY, KNEE, TOTAL, USING IMAGELESS COMPUTER-ASSISTED NAVIGATION
Anesthesia: Spinal | Site: Knee | Laterality: Left

## 2014-12-24 MED ORDER — DEXAMETHASONE SODIUM PHOSPHATE 4 MG/ML IJ SOLN
INTRAMUSCULAR | Status: AC
  Filled 2014-12-24: qty 1

## 2014-12-24 MED ORDER — METHOCARBAMOL 1000 MG/10ML IJ SOLN
500.0000 mg | Freq: Four times a day (QID) | INTRAVENOUS | Status: DC | PRN
  Administered 2014-12-24: 500 mg via INTRAVENOUS
  Filled 2014-12-24 (×2): qty 5

## 2014-12-24 MED ORDER — DOCUSATE SODIUM 100 MG PO CAPS
100.0000 mg | ORAL_CAPSULE | Freq: Two times a day (BID) | ORAL | Status: DC
  Administered 2014-12-24 – 2014-12-27 (×6): 100 mg via ORAL
  Filled 2014-12-24 (×6): qty 1

## 2014-12-24 MED ORDER — PROPOFOL 10 MG/ML IV BOLUS
INTRAVENOUS | Status: DC | PRN
  Administered 2014-12-24: 150 mg via INTRAVENOUS

## 2014-12-24 MED ORDER — HYDROMORPHONE HCL 1 MG/ML IJ SOLN
INTRAMUSCULAR | Status: AC
  Filled 2014-12-24: qty 1

## 2014-12-24 MED ORDER — CEFAZOLIN SODIUM-DEXTROSE 2-3 GM-% IV SOLR: 2.0000 g | INTRAVENOUS | Status: DC

## 2014-12-24 MED ORDER — MIDAZOLAM HCL 2 MG/2ML IJ SOLN
INTRAMUSCULAR | Status: AC
  Filled 2014-12-24: qty 2

## 2014-12-24 MED ORDER — GABAPENTIN 600 MG PO TABS
600.0000 mg | ORAL_TABLET | Freq: Four times a day (QID) | ORAL | Status: DC
  Administered 2014-12-24 – 2014-12-27 (×9): 600 mg via ORAL
  Filled 2014-12-24 (×14): qty 1

## 2014-12-24 MED ORDER — HYDROMORPHONE HCL 1 MG/ML IJ SOLN
1.0000 mg | INTRAMUSCULAR | Status: DC | PRN
  Administered 2014-12-24 – 2014-12-25 (×3): 1 mg via INTRAVENOUS
  Filled 2014-12-24 (×3): qty 1

## 2014-12-24 MED ORDER — BISACODYL 10 MG RE SUPP: 10.0000 mg | Freq: Every day | RECTAL | Status: DC | PRN

## 2014-12-24 MED ORDER — ONDANSETRON HCL 4 MG/2ML IJ SOLN: 4.0000 mg | Freq: Four times a day (QID) | INTRAMUSCULAR | Status: DC | PRN

## 2014-12-24 MED ORDER — ASPIRIN EC 325 MG PO TBEC
325.0000 mg | DELAYED_RELEASE_TABLET | Freq: Every day | ORAL | Status: DC
  Administered 2014-12-25 – 2014-12-27 (×3): 325 mg via ORAL
  Filled 2014-12-24 (×3): qty 1

## 2014-12-24 MED ORDER — ONDANSETRON HCL 4 MG/2ML IJ SOLN
INTRAMUSCULAR | Status: DC | PRN
  Administered 2014-12-24: 4 mg via INTRAVENOUS

## 2014-12-24 MED ORDER — DILTIAZEM HCL ER 120 MG PO CP24
120.0000 mg | ORAL_CAPSULE | Freq: Every day | ORAL | Status: DC
  Administered 2014-12-25 – 2014-12-27 (×3): 120 mg via ORAL
  Filled 2014-12-24 (×3): qty 1

## 2014-12-24 MED ORDER — ONDANSETRON HCL 4 MG PO TABS
4.0000 mg | ORAL_TABLET | Freq: Four times a day (QID) | ORAL | Status: DC | PRN
  Administered 2014-12-25: 4 mg via ORAL
  Filled 2014-12-24: qty 1

## 2014-12-24 MED ORDER — DEXAMETHASONE SODIUM PHOSPHATE 4 MG/ML IJ SOLN
INTRAMUSCULAR | Status: DC | PRN
  Administered 2014-12-24: 4 mg via INTRAVENOUS

## 2014-12-24 MED ORDER — SODIUM CHLORIDE 0.9 % IR SOLN
Status: DC | PRN
  Administered 2014-12-24: 3000 mL

## 2014-12-24 MED ORDER — ONDANSETRON HCL 4 MG/2ML IJ SOLN: 4.0000 mg | Freq: Once | INTRAMUSCULAR | Status: DC | PRN

## 2014-12-24 MED ORDER — BUPIVACAINE HCL (PF) 0.25 % IJ SOLN
INTRAMUSCULAR | Status: DC | PRN
  Administered 2014-12-24: 10 mL

## 2014-12-24 MED ORDER — LACTATED RINGERS IV SOLN
INTRAVENOUS | Status: DC
  Administered 2014-12-24 (×2): via INTRAVENOUS

## 2014-12-24 MED ORDER — ACETAMINOPHEN 650 MG RE SUPP: 650.0000 mg | Freq: Four times a day (QID) | RECTAL | Status: DC | PRN

## 2014-12-24 MED ORDER — FENTANYL CITRATE (PF) 250 MCG/5ML IJ SOLN
INTRAMUSCULAR | Status: AC
  Filled 2014-12-24: qty 5

## 2014-12-24 MED ORDER — PROPOFOL 10 MG/ML IV BOLUS
INTRAVENOUS | Status: AC
  Filled 2014-12-24: qty 20

## 2014-12-24 MED ORDER — PHENOL 1.4 % MT LIQD: 1.0000 | OROMUCOSAL | Status: DC | PRN

## 2014-12-24 MED ORDER — PANTOPRAZOLE SODIUM 40 MG PO TBEC
40.0000 mg | DELAYED_RELEASE_TABLET | Freq: Every day | ORAL | Status: DC
  Administered 2014-12-25 – 2014-12-27 (×3): 40 mg via ORAL
  Filled 2014-12-24 (×3): qty 1

## 2014-12-24 MED ORDER — MEPERIDINE HCL 25 MG/ML IJ SOLN: 6.2500 mg | INTRAMUSCULAR | Status: DC | PRN

## 2014-12-24 MED ORDER — ACETAMINOPHEN 325 MG PO TABS
650.0000 mg | ORAL_TABLET | Freq: Four times a day (QID) | ORAL | Status: DC | PRN
  Administered 2014-12-25: 650 mg via ORAL
  Filled 2014-12-24: qty 2

## 2014-12-24 MED ORDER — METOPROLOL TARTRATE 50 MG PO TABS
75.0000 mg | ORAL_TABLET | Freq: Two times a day (BID) | ORAL | Status: DC
  Administered 2014-12-24 – 2014-12-27 (×6): 75 mg via ORAL
  Filled 2014-12-24 (×12): qty 1

## 2014-12-24 MED ORDER — OXYCODONE HCL 5 MG PO TABS
5.0000 mg | ORAL_TABLET | Freq: Four times a day (QID) | ORAL | Status: DC | PRN
  Administered 2014-12-24 – 2014-12-27 (×12): 10 mg via ORAL
  Filled 2014-12-24 (×11): qty 2

## 2014-12-24 MED ORDER — MENTHOL 3 MG MT LOZG: 1.0000 | LOZENGE | OROMUCOSAL | Status: DC | PRN

## 2014-12-24 MED ORDER — BUPIVACAINE-EPINEPHRINE (PF) 0.5% -1:200000 IJ SOLN
INTRAMUSCULAR | Status: DC | PRN
  Administered 2014-12-24: 30 mL via PERINEURAL

## 2014-12-24 MED ORDER — CHLORHEXIDINE GLUCONATE 4 % EX LIQD
60.0000 mL | Freq: Once | CUTANEOUS | Status: DC
Start: 2014-12-24 — End: 2014-12-24

## 2014-12-24 MED ORDER — FENTANYL CITRATE (PF) 100 MCG/2ML IJ SOLN
INTRAMUSCULAR | Status: AC
  Administered 2014-12-24: 100 ug via INTRAVENOUS
  Administered 2014-12-24: 50 ug via INTRAVENOUS
  Filled 2014-12-24: qty 2

## 2014-12-24 MED ORDER — METHOCARBAMOL 500 MG PO TABS
500.0000 mg | ORAL_TABLET | Freq: Four times a day (QID) | ORAL | Status: DC | PRN
  Administered 2014-12-24 – 2014-12-27 (×9): 500 mg via ORAL
  Filled 2014-12-24 (×10): qty 1

## 2014-12-24 MED ORDER — POTASSIUM CHLORIDE IN NACL 20-0.45 MEQ/L-% IV SOLN
INTRAVENOUS | Status: DC
  Administered 2014-12-25: via INTRAVENOUS
  Filled 2014-12-24 (×8): qty 1000

## 2014-12-24 MED ORDER — METOCLOPRAMIDE HCL 5 MG PO TABS: 5.0000 mg | ORAL_TABLET | Freq: Three times a day (TID) | ORAL | Status: DC | PRN

## 2014-12-24 MED ORDER — MIDAZOLAM HCL 2 MG/2ML IJ SOLN
INTRAMUSCULAR | Status: AC
  Administered 2014-12-24: 2 mg via INTRAVENOUS
  Filled 2014-12-24: qty 2

## 2014-12-24 MED ORDER — HYDROMORPHONE HCL 1 MG/ML IJ SOLN
0.2500 mg | INTRAMUSCULAR | Status: DC | PRN
  Administered 2014-12-24 (×3): 0.5 mg via INTRAVENOUS

## 2014-12-24 MED ORDER — DEXTROSE 50 % IV SOLN
25.0000 mL | Freq: Once | INTRAVENOUS | Status: AC
  Administered 2014-12-24: 25 mL via INTRAVENOUS
  Filled 2014-12-24: qty 50

## 2014-12-24 MED ORDER — DEXTROSE 50 % IV SOLN
INTRAVENOUS | Status: AC
  Filled 2014-12-24: qty 50

## 2014-12-24 MED ORDER — GLIPIZIDE 5 MG PO TABS
10.0000 mg | ORAL_TABLET | Freq: Every day | ORAL | Status: DC
  Administered 2014-12-25 – 2014-12-27 (×3): 10 mg via ORAL
  Filled 2014-12-24 (×4): qty 2

## 2014-12-24 MED ORDER — LIDOCAINE HCL (CARDIAC) 20 MG/ML IV SOLN
INTRAVENOUS | Status: DC | PRN
  Administered 2014-12-24: 100 mg via INTRAVENOUS

## 2014-12-24 MED ORDER — LIDOCAINE HCL (CARDIAC) 20 MG/ML IV SOLN
INTRAVENOUS | Status: AC
  Filled 2014-12-24: qty 5

## 2014-12-24 MED ORDER — OXYCODONE HCL 5 MG PO TABS
ORAL_TABLET | ORAL | Status: AC
  Filled 2014-12-24: qty 2

## 2014-12-24 MED ORDER — ONDANSETRON HCL 4 MG/2ML IJ SOLN
INTRAMUSCULAR | Status: AC
  Filled 2014-12-24: qty 2

## 2014-12-24 MED ORDER — INSULIN NPH (HUMAN) (ISOPHANE) 100 UNIT/ML ~~LOC~~ SUSP
10.0000 [IU] | Freq: Every day | SUBCUTANEOUS | Status: DC
  Administered 2014-12-24 – 2014-12-26 (×3): 10 [IU] via SUBCUTANEOUS
  Filled 2014-12-24: qty 10

## 2014-12-24 MED ORDER — METFORMIN HCL 850 MG PO TABS
850.0000 mg | ORAL_TABLET | Freq: Two times a day (BID) | ORAL | Status: DC
  Administered 2014-12-25 – 2014-12-27 (×5): 850 mg via ORAL
  Filled 2014-12-24 (×8): qty 1

## 2014-12-24 MED ORDER — METOCLOPRAMIDE HCL 5 MG/ML IJ SOLN
5.0000 mg | Freq: Three times a day (TID) | INTRAMUSCULAR | Status: DC | PRN
  Administered 2014-12-25: 10 mg via INTRAVENOUS
  Filled 2014-12-24: qty 2

## 2014-12-24 MED ORDER — BUPIVACAINE HCL (PF) 0.25 % IJ SOLN
INTRAMUSCULAR | Status: AC
Start: 2014-12-24 — End: 2014-12-24
  Filled 2014-12-24: qty 30

## 2014-12-24 SURGICAL SUPPLY — 70 items
BANDAGE ELASTIC 4 VELCRO ST LF (GAUZE/BANDAGES/DRESSINGS) ×6 IMPLANT
BANDAGE ESMARK 6X9 LF (GAUZE/BANDAGES/DRESSINGS) ×2 IMPLANT
BENZOIN TINCTURE PRP APPL 2/3 (GAUZE/BANDAGES/DRESSINGS) ×3 IMPLANT
BLADE SAGITTAL 25.0X1.19X90 (BLADE) ×2 IMPLANT
BLADE SAGITTAL 25.0X1.19X90MM (BLADE) ×1
BLADE SAW SGTL 13X75X1.27 (BLADE) ×3 IMPLANT
BLADE SURG 10 STRL SS (BLADE) ×3 IMPLANT
BNDG ELASTIC 6X10 VLCR STRL LF (GAUZE/BANDAGES/DRESSINGS) ×3 IMPLANT
BNDG ESMARK 6X9 LF (GAUZE/BANDAGES/DRESSINGS) ×6
BOWL SMART MIX CTS (DISPOSABLE) ×3 IMPLANT
CAP KNEE TOTAL 3 SIGMA ×3 IMPLANT
CEMENT HV SMART SET (Cement) ×6 IMPLANT
CLOSURE STERI-STRIP 1/2X4 (GAUZE/BANDAGES/DRESSINGS) ×2
CLSR STERI-STRIP ANTIMIC 1/2X4 (GAUZE/BANDAGES/DRESSINGS) ×4 IMPLANT
COVER BACK TABLE 60X90IN (DRAPES) ×3 IMPLANT
COVER SURGICAL LIGHT HANDLE (MISCELLANEOUS) ×3 IMPLANT
CUFF TOURNIQUET SINGLE 34IN LL (TOURNIQUET CUFF) ×3 IMPLANT
CUFF TOURNIQUET SINGLE 44IN (TOURNIQUET CUFF) IMPLANT
DRAPE ORTHO SPLIT 77X108 STRL (DRAPES) ×4
DRAPE SURG ORHT 6 SPLT 77X108 (DRAPES) ×2 IMPLANT
DRAPE U-SHAPE 47X51 STRL (DRAPES) ×3 IMPLANT
DRSG PAD ABDOMINAL 8X10 ST (GAUZE/BANDAGES/DRESSINGS) ×6 IMPLANT
DURAPREP 26ML APPLICATOR (WOUND CARE) ×3 IMPLANT
ELECT REM PT RETURN 9FT ADLT (ELECTROSURGICAL) ×3
ELECTRODE REM PT RTRN 9FT ADLT (ELECTROSURGICAL) ×1 IMPLANT
FACESHIELD WRAPAROUND (MASK) ×6 IMPLANT
GAUZE SPONGE 4X4 12PLY STRL (GAUZE/BANDAGES/DRESSINGS) ×6 IMPLANT
GAUZE XEROFORM 5X9 LF (GAUZE/BANDAGES/DRESSINGS) ×3 IMPLANT
GLOVE BIOGEL PI IND STRL 8 (GLOVE) ×2 IMPLANT
GLOVE BIOGEL PI INDICATOR 8 (GLOVE) ×4
GLOVE ORTHO TXT STRL SZ7.5 (GLOVE) ×6 IMPLANT
GOWN STRL REUS W/ TWL LRG LVL3 (GOWN DISPOSABLE) ×1 IMPLANT
GOWN STRL REUS W/ TWL XL LVL3 (GOWN DISPOSABLE) ×1 IMPLANT
GOWN STRL REUS W/TWL 2XL LVL3 (GOWN DISPOSABLE) ×3 IMPLANT
GOWN STRL REUS W/TWL LRG LVL3 (GOWN DISPOSABLE) ×2
GOWN STRL REUS W/TWL XL LVL3 (GOWN DISPOSABLE) ×2
HANDPIECE INTERPULSE COAX TIP (DISPOSABLE) ×2
IMMOBILIZER KNEE 22 UNIV (SOFTGOODS) ×3 IMPLANT
KIT BASIN OR (CUSTOM PROCEDURE TRAY) ×3 IMPLANT
KIT ROOM TURNOVER OR (KITS) ×3 IMPLANT
MANIFOLD NEPTUNE II (INSTRUMENTS) ×3 IMPLANT
MARKER SPHERE PSV REFLC THRD 5 (MARKER) ×9 IMPLANT
NEEDLE HYPO 25GX1X1/2 BEV (NEEDLE) ×3 IMPLANT
NS IRRIG 1000ML POUR BTL (IV SOLUTION) ×3 IMPLANT
PACK TOTAL JOINT (CUSTOM PROCEDURE TRAY) ×3 IMPLANT
PACK UNIVERSAL I (CUSTOM PROCEDURE TRAY) ×3 IMPLANT
PAD ABD 8X10 STRL (GAUZE/BANDAGES/DRESSINGS) ×3 IMPLANT
PAD ARMBOARD 7.5X6 YLW CONV (MISCELLANEOUS) ×6 IMPLANT
PAD CAST 4YDX4 CTTN HI CHSV (CAST SUPPLIES) ×2 IMPLANT
PADDING CAST COTTON 4X4 STRL (CAST SUPPLIES) ×4
PADDING CAST COTTON 6X4 STRL (CAST SUPPLIES) ×6 IMPLANT
PIN SCHANZ 4MM 130MM (PIN) ×12 IMPLANT
SET HNDPC FAN SPRY TIP SCT (DISPOSABLE) ×1 IMPLANT
SPONGE GAUZE 4X4 12PLY STER LF (GAUZE/BANDAGES/DRESSINGS) ×3 IMPLANT
STAPLER VISISTAT 35W (STAPLE) ×3 IMPLANT
SUCTION FRAZIER TIP 10 FR DISP (SUCTIONS) ×3 IMPLANT
SUT ETHILON 3 0 FSL (SUTURE) ×3 IMPLANT
SUT VIC AB 0 CT1 27 (SUTURE) ×2
SUT VIC AB 0 CT1 27XBRD ANBCTR (SUTURE) ×1 IMPLANT
SUT VIC AB 1 CTX 36 (SUTURE) ×4
SUT VIC AB 1 CTX36XBRD ANBCTR (SUTURE) ×2 IMPLANT
SUT VIC AB 2-0 CT1 27 (SUTURE) ×4
SUT VIC AB 2-0 CT1 TAPERPNT 27 (SUTURE) ×2 IMPLANT
SUT VIC AB 3-0 CT1 27 (SUTURE) ×2
SUT VIC AB 3-0 CT1 TAPERPNT 27 (SUTURE) ×1 IMPLANT
SUT VIC AB 3-0 X1 27 (SUTURE) ×9 IMPLANT
SYR CONTROL 10ML LL (SYRINGE) ×3 IMPLANT
TOWEL OR 17X24 6PK STRL BLUE (TOWEL DISPOSABLE) ×3 IMPLANT
TOWEL OR 17X26 10 PK STRL BLUE (TOWEL DISPOSABLE) ×3 IMPLANT
WATER STERILE IRR 1000ML POUR (IV SOLUTION) ×3 IMPLANT

## 2014-12-24 NOTE — Brief Op Note (Signed)
12/24/2014  3:27 PM  PATIENT:  [name]  59 y.o. male  PRE-OPERATIVE DIAGNOSIS:  Osteoarthritis Left Knee  POST-OPERATIVE DIAGNOSIS:  Osteoarthritis Left Knee  PROCEDURE:  Procedure(s): COMPUTER ASSISTED TOTAL KNEE ARTHROPLASTY (Left)  SURGEON:  Surgeon(s) and Role:    * Eldred Manges, MD - Primary  PHYSICIAN ASSISTANT: Stamatia Masri m. Barry Dienes pa-c ANESTHESIA:   regional and general  EBL:  Total I/O In: 1200 [I.V.:1200] Out: -   BLOOD ADMINISTERED:none  DRAINS: none   LOCAL MEDICATIONS USED:  MARCAINE     SPECIMEN:  No Specimen  DISPOSITION OF SPECIMEN:  N/A  COUNTS:  YES  TOURNIQUET:   Total Tourniquet Time Documented: Thigh (Left) - 115260 minutes Total: Thigh (Left) - 115260 minutes   PATIENT DISPOSITION:  PACU - hemodynamically stable.

## 2014-12-24 NOTE — Anesthesia Postprocedure Evaluation (Signed)
Anesthesia Post Note  Patient: [name]  Procedure(s) Performed: Procedure(s) (LRB): COMPUTER ASSISTED TOTAL KNEE ARTHROPLASTY (Left)  Anesthesia type: general  Patient location: PACU  Post pain: Pain level controlled  Post assessment: Patient's Cardiovascular Status Stable  Last Vitals:  Filed Vitals:   12/24/14 1545  BP:   Pulse: 66  Temp:   Resp: 14    Post vital signs: Reviewed and stable  Level of consciousness: sedated  Complications: No apparent anesthesia complications

## 2014-12-24 NOTE — Anesthesia Preprocedure Evaluation (Addendum)
Anesthesia Evaluation  Patient identified by MRN, date of birth, ID band Patient awake    Reviewed: Allergy & Precautions, NPO status , Patient's Chart, lab work & pertinent test results, reviewed documented beta blocker date and time   History of Anesthesia Complications (+) history of anesthetic complications  Airway Mallampati: II  TM Distance: >3 FB Neck ROM: Full    Dental no notable dental hx.    Pulmonary sleep apnea ,  breath sounds clear to auscultation  Pulmonary exam normal       Cardiovascular hypertension, Pt. on medications Normal cardiovascular exam+ dysrhythmias Rhythm:Regular Rate:Normal     Neuro/Psych negative neurological ROS  negative psych ROS   GI/Hepatic Neg liver ROS, GERD-  Medicated and Controlled,  Endo/Other  negative endocrine ROSdiabetes, Type 2, Oral Hypoglycemic Agents, Insulin Dependent  Renal/GU negative Renal ROS  negative genitourinary   Musculoskeletal negative musculoskeletal ROS (+)   Abdominal   Peds negative pediatric ROS (+)  Hematology negative hematology ROS (+)   Anesthesia Other Findings   Reproductive/Obstetrics negative OB ROS                        Anesthesia Physical Anesthesia Plan  ASA: III  Anesthesia Plan: General   Post-op Pain Management: GA combined w/ Regional for post-op pain   Induction: Intravenous  Airway Management Planned:   Additional Equipment:   Intra-op Plan:   Post-operative Plan: Extubation in OR  Informed Consent: I have reviewed the patients History and Physical, chart, labs and discussed the procedure including the risks, benefits and alternatives for the proposed anesthesia with the patient or authorized representative who has indicated his/her understanding and acceptance.   Dental advisory given  Plan Discussed with: CRNA and Surgeon  Anesthesia Plan Comments:      Anesthesia Quick Evaluation

## 2014-12-24 NOTE — Interval H&P Note (Signed)
History and Physical Interval Note:  12/24/2014 2:13 PM  [name]  has presented today for surgery, with the diagnosis of Osteoarthritis Left Knee  The various methods of treatment have been discussed with the patient and family. After consideration of risks, benefits and other options for treatment, the patient has consented to  Procedure(s): COMPUTER ASSISTED TOTAL KNEE ARTHROPLASTY (Left) as a surgical intervention .  The patient's history has been reviewed, patient examined, no change in status, stable for surgery.  I have reviewed the patient's chart and labs.  Questions were answered to the patient's satisfaction.     Marv Alfrey C

## 2014-12-24 NOTE — Progress Notes (Signed)
Report to Robin RN as primary  

## 2014-12-24 NOTE — Progress Notes (Signed)
Orthopedic Tech Progress Note Patient Details:  [name] 1955-09-20 [id number] Applied CPM to LLE.  Applied OHF with trapeze to pt.'s bed. CPM Left Knee CPM Left Knee: On Left Knee Flexion (Degrees): 90 Left Knee Extension (Degrees): 0   [name] 12/24/2014, 4:05 PM

## 2014-12-24 NOTE — H&P (Signed)
TOTAL KNEE ADMISSION H&P  Patient is being admitted for left total knee arthroplasty.  Subjective:  Chief Complaint:left knee pain.  HPI: [name], 59 y.o. male, has a history of pain and functional disability in the left knee due to arthritis and has failed non-surgical conservative treatments for greater than 12 weeks to includeNSAID's and/or analgesics, use of assistive devices, weight reduction as appropriate and activity modification.  Onset of symptoms was gradual, starting 10 years ago with gradually worsening course since that time. .  Patient currently rates pain in the left knee(s) at 10 out of 10 with activity. Patient has night pain, worsening of pain with activity and weight bearing, crepitus and joint swelling.  Patient has evidence of subchondral cysts, periarticular osteophytes and joint space narrowing by imaging studies. There is no active infection.  There are no active problems to display for this patient.  Past Medical History  Diagnosis Date  . Hypertension   . Dysrhythmia     afib  . Sleep apnea     cpap for 2 yrs  . Diabetes mellitus without complication   . GERD (gastroesophageal reflux disease)   . Arthritis   . Complication of anesthesia     hx twice of tachy after surgery    Past Surgical History  Procedure Laterality Date  . Lithotriopsy  15    ? stone -could not find  . Joint replacement Right 10    knee  . Back surgery      x4 2 diskectomies -2 fusions last 08    Prescriptions prior to admission  Medication Sig Dispense Refill Last Dose  . aspirin EC 325 MG tablet Take 325 mg by mouth daily.     Marland Kitchen diltiazem (DILACOR XR) 120 MG 24 hr capsule Take 120 mg by mouth daily.     Marland Kitchen gabapentin (NEURONTIN) 600 MG tablet Take 600 mg by mouth 4 (four) times daily.     Marland Kitchen glipiZIDE (GLUCOTROL) 10 MG tablet Take 10 mg by mouth daily before breakfast.     . insulin NPH Human (HUMULIN N,NOVOLIN N) 100 UNIT/ML injection Inject 10 Units into the skin at  bedtime.     . metFORMIN (GLUCOPHAGE) 850 MG tablet Take 850 mg by mouth 2 (two) times daily with a meal.     . metoprolol (LOPRESSOR) 50 MG tablet Take 75 mg by mouth 2 (two) times daily.     . Omega-3 Fatty Acids (FISH OIL) 1000 MG CAPS Take 1,000 mg by mouth 2 (two) times daily.     Marland Kitchen omeprazole (PRILOSEC) 20 MG capsule Take 20 mg by mouth daily.      Allergies  Allergen Reactions  . Penicillins Swelling    Tongue swells    History  Substance Use Topics  . Smoking status: Never Smoker   . Smokeless tobacco: Not on file  . Alcohol Use: Yes     Comment: social    No family history on file.   Review of Systems  Constitutional: Negative.   HENT: Negative.   Eyes: Negative.   Respiratory: Negative.   Cardiovascular: Negative.   Genitourinary: Negative.   Musculoskeletal: Positive for joint pain.  Neurological: Negative.   Psychiatric/Behavioral: Negative.     Objective:  Physical Exam  Constitutional: He is oriented to person, place, and time. He appears well-developed.  HENT:  Head: Normocephalic.  Eyes: Pupils are equal, round, and reactive to light.  Neck: Normal range of motion.  Cardiovascular: Intact distal pulses.  Respiratory: No respiratory distress.  GI: He exhibits no distension.  Musculoskeletal: He exhibits edema and tenderness.  Neurological: He is alert and oriented to person, place, and time.  Skin: Skin is warm and dry.  Psychiatric: He has a normal mood and affect.   PHYSICAL EXAMINATION:  The patient has more lateral than medial compartment changes and 10 degrees of clinical valgus.  The opposite total knee is well fixed with good alignment.  The patient is alert and oriented.  He is 6 feet 3 inches tall and weighs 230 pounds.  Extraocular movements are intact.  There is good visual acuity with glasses.  Good cervical range of motion.  The heart is regular.  There is no murmur.  Pulses are regular.  No SVT at this point.  The abdomen is soft and  nontender.  The hips show full range of motion.  He has negative straight leg raising at 90 degrees.  The lumbar incision is well healed.  The right knee has had a previous ACL reconstruction in the distal past, now with total knee arthroplasty.  There is a well healed incision.  The distal pulses are 2+.  The knee reaches to within 2 degrees of full extension and flexes to 110 degrees.  The collateral ligaments are stable.  There is valgus deformity of the knee of 10 degrees Vital signs in last 24 hours:    Labs:   There is no height or weight on file to calculate BMI.   Imaging Review Plain radiographs demonstrate moderate degenerative joint disease of the left knee(s). The overall alignment ismild varus. The bone quality appears to be good for age and reported activity level.  Assessment/Plan:  End stage arthritis, left knee   The patient history, physical examination, clinical judgment of the provider and imaging studies are consistent with end stage degenerative joint disease of the left knee(s) and total knee arthroplasty is deemed medically necessary. The treatment options including medical management, injection therapy arthroscopy and arthroplasty were discussed at length. The risks and benefits of total knee arthroplasty were presented and reviewed. The risks due to aseptic loosening, infection, stiffness, patella tracking problems, thromboembolic complications and other imponderables were discussed. The patient acknowledged the explanation, agreed to proceed with the plan and consent was signed. Patient is being admitted for inpatient treatment for surgery, pain control, PT, OT, prophylactic antibiotics, VTE prophylaxis, progressive ambulation and ADL's and discharge planning. The patient is planning to be discharged home with home health services

## 2014-12-24 NOTE — Transfer of Care (Signed)
Immediate Anesthesia Transfer of Care Note  Patient: [name]  Procedure(s) Performed: Procedure(s): COMPUTER ASSISTED TOTAL KNEE ARTHROPLASTY (Left)  Patient Location: PACU  Anesthesia Type:GA combined with regional for post-op pain  Level of Consciousness: awake, alert , oriented and patient cooperative  Airway & Oxygen Therapy: Patient Spontanous Breathing and Patient connected to nasal cannula oxygen  Post-op Assessment: Report given to RN, Post -op Vital signs reviewed and stable and Patient moving all extremities  Post vital signs: Reviewed and stable  Last Vitals:  Filed Vitals:   12/24/14 1520  BP:   Pulse:   Temp: 36.7 C  Resp:     Complications: No apparent anesthesia complications

## 2014-12-24 NOTE — Anesthesia Procedure Notes (Addendum)
Anesthesia Regional Block:  Adductor canal block  Pre-Anesthetic Checklist: ,, timeout performed, Correct Patient, Correct Site, Correct Laterality, Correct Procedure, Correct Position, site marked, Risks and benefits discussed,  Surgical consent,  Pre-op evaluation,  At surgeon's request and post-op pain management  Laterality: Left  Prep: chloraprep       Needles:  Injection technique: Single-shot  Needle Type: Echogenic Stimulator Needle      Needle Gauge: 21 and 21 G    Additional Needles:  Procedures: ultrasound guided (picture in chart) Adductor canal block Narrative:  Start time: 12/24/2014 12:15 PM End time: 12/24/2014 12:30 PM Injection made incrementally with aspirations every 5 mL.  Performed by: Personally  Anesthesiologist: Arta Bruce  Additional Notes: Monitors applied. Patient sedated. Sterile prep and drape,hand hygiene and sterile gloves were used. Relevant anatomy identified.Needle position confirmed.Local anesthetic injected incrementally after negative aspiration. Local anesthetic spread visualized around nerve(s). Vascular puncture avoided. No complications. Image printed for medical record.The patient tolerated the procedure well.    Arta Bruce MD   Procedure Name: LMA Insertion Date/Time: 12/24/2014 12:48 PM Performed by: Jeani Hawking Pre-anesthesia Checklist: Patient identified, Emergency Drugs available, Suction available, Patient being monitored and Timeout performed Patient Re-evaluated:Patient Re-evaluated prior to inductionOxygen Delivery Method: Circle system utilized Preoxygenation: Pre-oxygenation with 100% oxygen Intubation Type: IV induction Ventilation: Mask ventilation without difficulty LMA: LMA inserted LMA Size: 5.0 Number of attempts: 1 Placement Confirmation: positive ETCO2 and breath sounds checked- equal and bilateral Tube secured with: Tape Dental Injury: Teeth and Oropharynx as per pre-operative assessment

## 2014-12-24 NOTE — Progress Notes (Signed)
Pt in AFIB. HR 40s-50s. CBG=79. Pt asymptomatic. Dr.Ossey called to bedside. No new orders. Will cont to monitor.

## 2014-12-25 ENCOUNTER — Encounter (HOSPITAL_COMMUNITY): Payer: Self-pay | Admitting: Orthopaedic Surgery

## 2014-12-25 LAB — BASIC METABOLIC PANEL
Anion gap: 9 (ref 5–15)
BUN: 9 mg/dL (ref 6–20)
CALCIUM: 9 mg/dL (ref 8.9–10.3)
CHLORIDE: 98 mmol/L — AB (ref 101–111)
CO2: 26 mmol/L (ref 22–32)
Creatinine, Ser: 1.17 mg/dL (ref 0.61–1.24)
GFR calc Af Amer: >60 mL/min (ref >60)
GLUCOSE: 195 mg/dL — AB (ref 65–99)
POTASSIUM: 4.5 mmol/L (ref 3.5–5.1)
Sodium: 133 mmol/L — ABNORMAL LOW (ref 135–145)

## 2014-12-25 LAB — CBC
HCT: 41.1 % (ref 39.0–52.0)
Hemoglobin: 13.4 g/dL (ref 13.0–17.0)
MCH: 26.2 pg (ref 26.0–34.0)
MCHC: 32.6 g/dL (ref 30.0–36.0)
MCV: 80.4 fL (ref 78.0–100.0)
Platelets: 208 10*3/uL (ref 150–400)
RBC: 5.11 MIL/uL (ref 4.22–5.81)
RDW: 13.4 % (ref 11.5–15.5)
WBC: 12.8 10*3/uL — AB (ref 4.0–10.5)

## 2014-12-25 LAB — GLUCOSE, CAPILLARY
Glucose-Capillary: 175 mg/dL — ABNORMAL HIGH (ref 65–99)
Glucose-Capillary: 240 mg/dL — ABNORMAL HIGH (ref 65–99)
Glucose-Capillary: 150 mg/dL — ABNORMAL HIGH (ref 65–99)
Glucose-Capillary: 122 mg/dL — ABNORMAL HIGH (ref 65–99)

## 2014-12-25 MED ORDER — INSULIN ASPART 100 UNIT/ML ~~LOC~~ SOLN
0.0000 [IU] | Freq: Three times a day (TID) | SUBCUTANEOUS | Status: DC
  Administered 2014-12-25: 2 [IU] via SUBCUTANEOUS
  Administered 2014-12-25: 5 [IU] via SUBCUTANEOUS
  Administered 2014-12-26: 3 [IU] via SUBCUTANEOUS
  Administered 2014-12-26: 2 [IU] via SUBCUTANEOUS
  Administered 2014-12-26: 5 [IU] via SUBCUTANEOUS
  Administered 2014-12-27 (×2): 2 [IU] via SUBCUTANEOUS

## 2014-12-25 NOTE — Progress Notes (Signed)
Physical Therapy Treatment Patient Details Name: [name] MRN: [id number] DOB: 18-Mar-1956 Today's Date: 12/25/2014    History of Present Illness 59 yo male with OA L knee and TKA 12/24/14    PT Comments    Good effort today with afternoon session much better and less influence of nausea.  Pt is back in CPM at end of session and ice applied, with better comfort and relief of worst of his pain from mobility and ice.  Follow Up Recommendations  SNF     Equipment Recommendations  Rolling walker with 5" wheels    Recommendations for Other Services       Precautions / Restrictions Precautions Precautions: Knee;Fall Precaution Booklet Issued: Yes (comment) Precaution Comments: reviewed and covered initial exercises Required Braces or Orthoses: Knee Immobilizer - Left Restrictions Weight Bearing Restrictions: Yes LLE Weight Bearing: Weight bearing as tolerated    Mobility  Bed Mobility Overal bed mobility: Needs Assistance Bed Mobility: Supine to Sit     Supine to sit: Min assist     General bed mobility comments: mainly help for LLE and to slightly support trunk  Transfers Overall transfer level: Needs assistance Equipment used: Rolling walker (2 wheeled);1 person hand held assist   Sit to Stand: Min assist Stand pivot transfers: Min assist       General transfer comment: cued hand placement and bed elevated  Ambulation/Gait Ambulation/Gait assistance: Min guard Ambulation Distance (Feet): 100 Feet Assistive device: Rolling walker (2 wheeled);1 person hand held assist Gait Pattern/deviations: Step-through pattern;Narrow base of support;Antalgic;Decreased weight shift to left;Decreased stance time - left Gait velocity: reduced Gait velocity interpretation: Below normal speed for age/gender     Stairs            Wheelchair Mobility    Modified Rankin (Stroke Patients Only)       Balance Overall balance assessment: Needs  assistance Sitting-balance support: Feet supported Sitting balance-Leahy Scale: Good     Standing balance support: Bilateral upper extremity supported Standing balance-Leahy Scale: Fair                      Cognition Arousal/Alertness: Awake/alert Behavior During Therapy: WFL for tasks assessed/performed Overall Cognitive Status: Within Functional Limits for tasks assessed                      Exercises Total Joint Exercises Ankle Circles/Pumps: AROM;Both;5 reps Quad Sets: AROM;Both;15 reps Gluteal Sets: AROM;Both;15 reps Heel Slides: AAROM;AROM;Both;10 reps Hip ABduction/ADduction: AROM;Both;10 reps Straight Leg Raises: AAROM;Left;10 reps    General Comments General comments (skin integrity, edema, etc.): Pt more energetic today and able to do supervision level balance for commode standing       Pertinent Vitals/Pain Pain Assessment: 0-10 Pain Score: 6  Pain Location: L thigh Pain Intervention(s): Premedicated before session;Repositioned;Monitored during session    Home Living                      Prior Function            PT Goals (current goals can now be found in the care plan section) Acute Rehab PT Goals Patient Stated Goal: to get home safely PT Goal Formulation: With patient Time For Goal Achievement: 01/08/15 Potential to Achieve Goals: Good    Frequency  BID    PT Plan      Co-evaluation             End of Session Equipment Utilized During  Treatment: Left knee immobilizer Activity Tolerance: Patient tolerated treatment well;No increased pain;Other (comment) (Nausea) Patient left: with call bell/phone within reach;in bed;with family/visitor present     Time: 1415-1446 PT Time Calculation (min) (ACUTE ONLY): 31 min  Charges:  $Gait Training: 8-22 mins $Therapeutic Exercise: 8-22 mins                    G Codes:      Ivar Drape 01-15-15, 4:24 PM   Samul Dada, PT MS Acute Rehab Dept. Number: [facility] [id number]  and [facility] [phone]

## 2014-12-25 NOTE — Progress Notes (Signed)
Subjective: Doing well  Pain controlled.     Objective: Vital signs in last 24 hours: Temp:  [97.5 F (36.4 C)-98.4 F (36.9 C)] 97.7 F (36.5 C) (08/02 0504) Pulse Rate:  [39-102] 67 (08/02 0504) Resp:  [11-20] 18 (08/02 0504) BP: (126-157)/(72-103) 133/94 mmHg (08/02 0504) SpO2:  [96 %-100 %] 100 % (08/02 0504) Weight:  [106.142 kg (234 lb)] 106.142 kg (234 lb) (08/01 1059)  Intake/Output from previous day: 08/01 0701 - 08/02 0700 In: 1570 [P.O.:120; I.V.:1400; IV Piggyback:50] Out: 1200 [Urine:1200] Intake/Output this shift:     Recent Labs  12/25/14 0441  HGB 13.4    Recent Labs  12/25/14 0441  WBC 12.8*  RBC 5.11  HCT 41.1  PLT 208    Recent Labs  12/25/14 0441  NA 133*  K 4.5  CL 98*  CO2 26  BUN 9  CREATININE 1.17  GLUCOSE 195*  CALCIUM 9.0   No results for input(s): LABPT, INR in the last 72 hours.  Exam:  Alert and oriented.  Dressing was reinforced.  Calf nontender, NVI.    Assessment/Plan: Patient will need short snf placement for rehab.  Will have very limited assistance.  Social work consult. Should be ready for transfer Wed or Thurs.  D/c dilaudid.    [name] M 12/25/2014, 8:33 AM

## 2014-12-25 NOTE — Progress Notes (Signed)
Orthopedic Tech Progress Note Patient Details:  [name] May 04, 1956 [id number] On cpm at 6:40 pm Patient ID: [name], male   DOB: 04-14-1956, 59 y.o.   MRN: [id number]   [name] 12/25/2014, 6:42 PM

## 2014-12-25 NOTE — Op Note (Addendum)
NAMEJEREN, DUFRANE                  ACCOUNT NO.:  [id number]  MEDICAL RECORD NO.:  [id number]  LOCATION:  5N10C                        FACILITY:  [facility]  PHYSICIAN:  Mark C. Ophelia Charter, M.D.    DATE OF BIRTH:  Jan 19, 1956  DATE OF PROCEDURE:  12/24/2014 DATE OF DISCHARGE:                              OPERATIVE REPORT   PREOPERATIVE DIAGNOSIS:  Left knee primary osteoarthritis.  POSTOPERATIVE DIAGNOSIS:  Left knee primary osteoarthritis.  PROCEDURE:  Computer-assist, left cemented total knee arthroplasty.  SURGEON:  Mark C. Ophelia Charter, MD  ASSISTANT:  Lestine Mount, PA-C, medically necessary and present for the entire procedure.  ANESTHESIA:  General plus preoperative block.  IMPLANTS:  #5 LCS DePuy femur rotating platform, #4 tibia, #5 10 mm poly spacer, 38 mm 3-pegged patella.  TOURNIQUET TIME:  55 minutes.  COMPLICATIONS:  None.  PROCEDURE IN DETAIL:  After induction of general anesthesia, proximal thigh tourniquet, lateral post heel bump, left leg was prepped from the foot to the tourniquet with DuraPrep, the usual total knee sheets, drapes, impervious stockinette, Coban, sterile skin marker and Betadine Steri-Drape was used to seal the skin.  Time-out procedure was completed.  Ancef prophylaxis and wrapping the leg with a 6-inch Esmarch prior to tourniquet inflation.  Midline incision was made.  Medial parapatellar incision was made.  Pins were placed in the femur, stab incision, mid tibia for computer navigation.  The patient had 45 degrees varus and 2 degree flexion contracture.  Meniscal remnants were resected, bone-on-bone changes, the lateral compartment as well as medial compartment grade 4 changes of patellofemoral for patient's primary knee osteoarthritis.  ACL, PCL was resected.  Computer information was entered for both the tibia and the femur, 9 mm taken off the femur, 9 on the tibia.  Chamfer cuts were made and sizes were identical to the opposite total knee arthroplasty  which were 5 femur, 4 tibia, 10 mm insert.  Knee reached full extension.  There were some meniscal remnants on the lateral side and posterior spurs on the lateral side which were resected with 3/4 osteotome which allowed full extension, good collateral ligament balance within 1 mm.  Pulse lavage tourniquet deflation.  After cemented components, tibia, femur, poly spacer positioning, meticulous cement removal, and then 3 pegged patella.  There was some cement laterally along the tibial aspect in the gutter, which was removed.  The patient tolerated the procedure well. The tourniquet was deflated after 15 minutes.  The cement was hard, meticulous closure, subcuticular skin closure after interrupted #1 in the capsule retinaculum quad tendon, 2-0 in the subcutaneous tissue, subcuticular closure, Steri-Strips, postop dressing; and transferred to the recovery room in stable condition with the knee immobilizer.     Mark C. Ophelia Charter, M.D.     MCY/MEDQ  D:  12/24/2014  T:  12/25/2014  Job:  [id number]

## 2014-12-25 NOTE — Evaluation (Signed)
Physical Therapy Evaluation Patient Details Name: [name] MRN: [id number] DOB: 01-15-1956 Today's Date: 12/25/2014   History of Present Illness  59 yo male with OA L knee and TKA 12/24/14  Clinical Impression  Pt was seen for evaluation of his gait and was nauseated with initial efforts.  However, he could maintain O2 sats off nasal cannula and was less sick sitting up in chair.  He is planning SNF as he has no help at home and is in need of help with all mobility.  Very motivated and will be able to recover well and quickly in SNF.    Follow Up Recommendations SNF    Equipment Recommendations  Rolling walker with 5" wheels    Recommendations for Other Services       Precautions / Restrictions Precautions Precautions: Knee;Fall Precaution Booklet Issued: Yes (comment) Precaution Comments: reviewed and covered initial exercises Required Braces or Orthoses: Knee Immobilizer - Left Restrictions Weight Bearing Restrictions: Yes LLE Weight Bearing: Weight bearing as tolerated      Mobility  Bed Mobility Overal bed mobility: Needs Assistance Bed Mobility: Supine to Sit     Supine to sit: Min assist     General bed mobility comments: mainly help for LLE and to slightly support trunk  Transfers Overall transfer level: Needs assistance Equipment used: Rolling walker (2 wheeled);1 person hand held assist Transfers: Sit to/from [employer] Sit to Stand: Min assist Stand pivot transfers: Min assist       General transfer comment: cued hand placement and bed elevated  Ambulation/Gait Ambulation/Gait assistance: Min assist Ambulation Distance (Feet): 14 Feet Assistive device: Rolling walker (2 wheeled);1 person hand held assist Gait Pattern/deviations: Step-through pattern;Wide base of support;Trunk flexed Gait velocity: reduced Gait velocity interpretation: Below normal speed for age/gender    Stairs            Wheelchair Mobility     Modified Rankin (Stroke Patients Only)       Balance Overall balance assessment: Needs assistance Sitting-balance support: Feet supported Sitting balance-Leahy Scale: Good     Standing balance support: Bilateral upper extremity supported Standing balance-Leahy Scale: Fair                               Pertinent Vitals/Pain Pain Assessment: 0-10 Pain Score: 6  Pain Location: L thigh Pain Descriptors / Indicators: Cramping Pain Intervention(s): Limited activity within patient's tolerance;Monitored during session;Repositioned    Home Living Family/patient expects to be discharged to:: Skilled nursing facility Living Arrangements: Spouse/significant other               Additional Comments: Wife will not be able to assist.  Home is level inside with ramp to enter, has no equipment x SPC    Prior Function Level of Independence: Independent               Hand Dominance        Extremity/Trunk Assessment   Upper Extremity Assessment: Overall WFL for tasks assessed           Lower Extremity Assessment: LLE deficits/detail   LLE Deficits / Details: new TKA with stiffness and weakness  Cervical / Trunk Assessment: Normal  Communication   Communication: No difficulties  Cognition Arousal/Alertness: Awake/alert Behavior During Therapy: WFL for tasks assessed/performed Overall Cognitive Status: Within Functional Limits for tasks assessed  General Comments General comments (skin integrity, edema, etc.): Pt was able to walk but became too nauseated to continue, but better sitting.  No O2 used, 99% pre gait and 97% post gait    Exercises Total Joint Exercises Ankle Circles/Pumps: AROM;Both;5 reps Quad Sets: AROM;Both;15 reps Gluteal Sets: AROM;Both;15 reps      Assessment/Plan    PT Assessment Patient needs continued PT services  PT Diagnosis Acute pain;Difficulty walking   PT Problem List Decreased  strength;Decreased range of motion;Decreased activity tolerance;Decreased balance;Decreased mobility;Decreased coordination;Decreased knowledge of precautions;Pain;Decreased skin integrity  PT Treatment Interventions DME instruction;Gait training;Functional mobility training;Therapeutic activities;Therapeutic exercise;Balance training;Neuromuscular re-education;Patient/family education   PT Goals (Current goals can be found in the Care Plan section) Acute Rehab PT Goals Patient Stated Goal: to get home safely PT Goal Formulation: With patient Time For Goal Achievement: 01/08/15 Potential to Achieve Goals: Good    Frequency BID   Barriers to discharge Decreased caregiver support wife is in inpt therapy    Co-evaluation               End of Session Equipment Utilized During Treatment: Left knee immobilizer Activity Tolerance: Patient tolerated treatment well;No increased pain;Other (comment) (Nausea) Patient left: in chair;with call bell/phone within reach;with nursing/sitter in room Nurse Communication: Mobility status         Time: 1610-9604 PT Time Calculation (min) (ACUTE ONLY): 30 min   Charges:   PT Evaluation $Initial PT Evaluation Tier I: 1 Procedure PT Treatments $Therapeutic Exercise: 8-22 mins   PT G Codes:        Ivar Drape Dec 27, 2014, 10:00 AM   Samul Dada, PT MS Acute Rehab Dept. Number: [facility] [id number] and [facility] [phone]

## 2014-12-25 NOTE — Progress Notes (Signed)
RT placed patient on CPAP. Patient setting is auto 6-20 cmH2O. Patient does not use humidification. RT will continue to monitor.

## 2014-12-25 NOTE — Clinical Social Work Note (Signed)
Clinical Social Work Assessment  Patient Details  Name: [name] MRN: [id number] Date of Birth: [birth year]  Date of referral:  12/25/14               Reason for consult:  Facility Placement                Permission sought to share information with:  Magazine features editor, Other Personnel officer) Permission granted to share information::  Yes, Verbal Permission Granted  Name::     [facility] and [place] [facility]     Housing/Transportation Living arrangements for the past 2 months:  Single Family Home Source of Information:  Patient Patient Interpreter Needed:  None Criminal Activity/Legal Involvement Pertinent to Current Situation/Hospitalization:  No - Comment as needed Significant Relationships:  Spouse Lives with:  Spouse Do you feel safe going back to the place where you live?  Yes Need for family participation in patient care:  No (Coment)  Care giving concerns: Patient requesting short term rehab as he does not feel his wife can assist him enough at home.     Social Worker assessment / plan:  59 year old male admitted from home where he lives with his wife. Patient underwent elective surgery for total knee replacement and was requesting short term SNF for rehab and then return home.  Discussed patient's insurance - [facility] [employer] which is a product of the [employer]. He states that he is 7% service connected.  CSW spoke with representative of [facility] [employer] in great length to determine SNF network for care coverage.  The closest network facilities were in [place] and [place] only- no authorized facilities in [place] or surrounding areas.  Patient would not have insurance coverage and does not feel that he can pay privately at the SNF.  CSW discussed option of home health services for RN and PT. Patient feels that this is acceptable and that he only wanted SNF because he does not feel that his wife can help him during recovery period.  Message left for unit RNCM to  follow up with patient to arrange home care services.   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 They do not accept his insurance.    Patient/Family's Understanding of and Emotional Response to Diagnosis, Current Treatment, and Prognosis:  Patient is well aware of current medical condition, diagnosis and treatment. He has been in contact with his insurance company to insure that authorization was given for his surgery.  He wanted SNF Rehab but does not want to leave the [place] area.  RNCM will need to arrange home health and DME as indicated for patient.  CSW signing off.     Emotional Assessment Appearance:  Appears stated age Attitude/Demeanor/Rapport:   (Relaxed, friendly, calm, cooperative and appropriate for situation) Affect (typically observed):  Calm, Pleasant (Dissappointed with rehab plan) Orientation:  Oriented to Self, Oriented to Place, Oriented to  Time, Oriented to Situation Alcohol / Substance use:  Never Used Psych involvement (Current and /or in the community):  No (Comment)  Discharge Needs  Concerns to be addressed:  Discharge Planning Concerns, Care Coordination Readmission within the last 30 days:  No Current discharge risk:  None Barriers to Discharge:  Continued Medical Work up   Toy Baker 12/25/2014, 8:54 PM

## 2014-12-25 NOTE — Care Management Note (Signed)
Case Management Note  Patient Details  Name: ARVIE BARTHOLOMEW MRN: [id number] Date of Birth: [birth year]  Subjective/Objective:   59 yr old male s/p left total knee arthroplasty                 Action/Plan:  Case manager spoke with patient and wife concerning discharge plan. Patient will need shortterm rehab at [facility]. Social worker is aware.    Expected Discharge Date:                  Expected Discharge Plan:  Skilled Nursing Facility  In-House Referral:  Clinical Social Work  Discharge planning Services  CM Consult  Post Acute Care Choice:    Choice offered to:  NA  DME Arranged:    DME Agency:     HH Arranged:  NA HH Agency:     Status of Service:  In process, will continue to follow  Medicare Important Message Given:    Date Medicare IM Given:    Medicare IM give by:    Date Additional Medicare IM Given:    Additional Medicare Important Message give by:     If discussed at Long Length of Stay Meetings, dates discussed:    Additional Comments:  Durenda Guthrie, RN 12/25/2014, 11:36 AM

## 2014-12-25 NOTE — Plan of Care (Signed)
Problem: Consults Goal: Diagnosis- Total Joint Replacement Primary Total Knee Left     

## 2014-12-25 NOTE — Progress Notes (Signed)
While patient is her at the hospital his CPAP setting was auto 6-20 cmH20. He stated that he uses auto setting at home but could not remember what setting he uses.

## 2014-12-25 NOTE — Progress Notes (Signed)
OT Cancellation Note  Patient Details Name: [name] MRN: [id number] DOB: 04-28-1956   Cancelled Treatment:    Reason Eval/Treat Not Completed: OT screened. Pt's current D/C plan is SNF. No apparent immediate acute care OT needs, therefore will defer OT to SNF. If OT eval is needed please call Acute Rehab Dept. at [phone] or text page OT at [phone].    Earlie Raveling OTR/L [phone] 12/25/2014, 3:07 PM

## 2014-12-26 ENCOUNTER — Encounter (HOSPITAL_COMMUNITY): Payer: Self-pay | Admitting: General Practice

## 2014-12-26 LAB — CBC
HCT: 35 % — ABNORMAL LOW (ref 39.0–52.0)
HEMOGLOBIN: 11.5 g/dL — AB (ref 13.0–17.0)
MCH: 26.4 pg (ref 26.0–34.0)
MCHC: 32.9 g/dL (ref 30.0–36.0)
MCV: 80.5 fL (ref 78.0–100.0)
PLATELETS: 169 10*3/uL (ref 150–400)
RBC: 4.35 MIL/uL (ref 4.22–5.81)
RDW: 13.5 % (ref 11.5–15.5)
WBC: 11.5 10*3/uL — ABNORMAL HIGH (ref 4.0–10.5)

## 2014-12-26 LAB — GLUCOSE, CAPILLARY
GLUCOSE-CAPILLARY: 162 mg/dL — AB (ref 65–99)
GLUCOSE-CAPILLARY: 217 mg/dL — AB (ref 65–99)
GLUCOSE-CAPILLARY: 115 mg/dL — AB (ref 65–99)
Glucose-Capillary: 121 mg/dL — ABNORMAL HIGH (ref 65–99)

## 2014-12-26 MED ORDER — METHOCARBAMOL 500 MG PO TABS: 500.0000 mg | ORAL_TABLET | Freq: Four times a day (QID) | ORAL | Status: DC | PRN

## 2014-12-26 MED ORDER — OXYCODONE-ACETAMINOPHEN 10-325 MG PO TABS: 1.0000 | ORAL_TABLET | Freq: Four times a day (QID) | ORAL | Status: DC | PRN

## 2014-12-26 NOTE — Progress Notes (Signed)
Pt refuses to wear CPAP. Pt stated it was uncomfortable and did not want to wear tonight. No distress noted. Pt encouraged to call RT if pt changes mind.

## 2014-12-26 NOTE — Progress Notes (Signed)
Subjective: Doing well.  Pain controlled.  States that he would like to go to snf for rehab in [place].     Objective: Vital signs in last 24 hours: Temp:  [98 F (36.7 C)-98.9 F (37.2 C)] 98 F (36.7 C) (08/03 0647) Pulse Rate:  [82-92] 92 (08/03 0647) Resp:  [18] 18 (08/03 0647) BP: (140-157)/(96-102) 140/96 mmHg (08/03 0647) SpO2:  [96 %-98 %] 96 % (08/03 0647)  Intake/Output from previous day: 08/02 0701 - 08/03 0700 In: 720 [P.O.:720] Out: 400 [Urine:400] Intake/Output this shift: Total I/O In: 240 [P.O.:240] Out: -    Recent Labs  12/25/14 0441 12/26/14 0830  HGB 13.4 11.5*    Recent Labs  12/25/14 0441 12/26/14 0830  WBC 12.8* 11.5*  RBC 5.11 4.35  HCT 41.1 35.0*  PLT 208 169    Recent Labs  12/25/14 0441  NA 133*  K 4.5  CL 98*  CO2 26  BUN 9  CREATININE 1.17  GLUCOSE 195*  CALCIUM 9.0   No results for input(s): LABPT, INR in the last 72 hours.  Neurovascular intact Dorsiflexion/Plantar flexion intact No cellulitis present Compartment soft  Assessment/Plan: Social worker to arrange transfer to snf in [place].  Ok to go when bed available.     [name] M 12/26/2014, 12:45 PM

## 2014-12-26 NOTE — Progress Notes (Signed)
Physical Therapy Treatment Patient Details Name: [name] MRN: [id number] DOB: November 16, 1955 Today's Date: 12/26/2014    History of Present Illness 59 yo male with OA L knee and TKA 12/24/14    PT Comments    Pt is progressing with ambulation and increased distance this session. Opted to limit ambulation distance 2/2 having visitors in room. Unsure if insurance will cover SNF. If pt unable to go to SNF, pt will require HHPT for ongoing rehab to increase functional independence.   Follow Up Recommendations  SNF;Other (comment) (If unable for SNF recommend HHPT.)     Equipment Recommendations  Rolling walker with 5" wheels    Recommendations for Other Services       Precautions / Restrictions Precautions Precautions: Knee;Fall Required Braces or Orthoses: Knee Immobilizer - Left Restrictions LLE Weight Bearing: Weight bearing as tolerated    Mobility  Bed Mobility Overal bed mobility: Needs Assistance Bed Mobility: Supine to Sit;Sit to Supine     Supine to sit: Min assist;HOB elevated Sit to supine: Min assist;HOB elevated   General bed mobility comments: Min A to manage LLE. Overall bed mobility good.  Transfers Overall transfer level: Needs assistance Equipment used: Rolling walker (2 wheeled) Transfers: Sit to/from Stand Sit to Stand: Min guard         General transfer comment: Cues for hand placement and technique. Min G for safety.  Ambulation/Gait Ambulation/Gait assistance: Min guard Ambulation Distance (Feet): 100 Feet Assistive device: Rolling walker (2 wheeled) Gait Pattern/deviations: Step-to pattern;Step-through pattern;Decreased stride length;Trunk flexed   Gait velocity interpretation: Below normal speed for age/gender General Gait Details: Cues for upright posture and to initiate L knee extension when in mid-stance.   Stairs            Wheelchair Mobility    Modified Rankin (Stroke Patients Only)       Balance                                     Cognition Arousal/Alertness: Awake/alert Behavior During Therapy: WFL for tasks assessed/performed Overall Cognitive Status: Within Functional Limits for tasks assessed                      Exercises Total Joint Exercises Quad Sets: AROM;Both;10 reps;Supine Heel Slides: AAROM;10 reps;Left;Supine Hip ABduction/ADduction: AROM;10 reps;Left;Supine Straight Leg Raises: AAROM;Left;10 reps;Supine    General Comments        Pertinent Vitals/Pain Pain Score: 1  Pain Location: L knee Pain Descriptors / Indicators: Aching;Sore;Tightness Pain Intervention(s): Limited activity within patient's tolerance;Monitored during session;Repositioned    Home Living Family/patient expects to be discharged to:: Unsure Living Arrangements: Spouse/significant other                  Prior Function            PT Goals (current goals can now be found in the care plan section) Progress towards PT goals: Progressing toward goals    Frequency  BID    PT Plan Current plan remains appropriate    Co-evaluation             End of Session Equipment Utilized During Treatment: Gait belt Activity Tolerance: Patient tolerated treatment well Patient left: in bed;with call bell/phone within reach;with family/visitor present     Time: 1358-1430 PT Time Calculation (min) (ACUTE ONLY): 32 min  Charges:  G CodesNita Sells, SPTA 2015-01-24, 3:05 PM

## 2014-12-26 NOTE — Progress Notes (Signed)
Orthopedic Tech Progress Note Patient Details:  [name] 11-Mar-1956 [id number]  CPM Left Knee CPM Left Knee: Off Left Knee Flexion (Degrees): 83 Left Knee Extension (Degrees): 15 Additional Comments: actual measures Placed pt's lle on cpm @ 0-90 degrees   [name] 12/26/2014, 2:55 PM

## 2014-12-26 NOTE — Discharge Instructions (Signed)

## 2014-12-26 NOTE — Progress Notes (Signed)
Called MD to make him aware that patient is not going to be d/c to SNF and that patient wont be able to go home until tomorrow.

## 2014-12-26 NOTE — Progress Notes (Signed)
Physical Therapy Treatment Patient Details Name: [name] MRN: [id number] DOB: 17-Jun-1955 Today's Date: 12/26/2014    History of Present Illness 59 yo male with OA L knee and TKA 12/24/14    PT Comments    Pt c/o pain during ambulation and opted to limit gait distance this session. Will try to progress with ambulation next session per pt's pain rating. Will continue to follow acutely as pt will benefit from skilled PT to increase functional independence and safety.   Follow Up Recommendations  SNF     Equipment Recommendations  Rolling walker with 5" wheels    Recommendations for Other Services       Precautions / Restrictions Precautions Precautions: Knee;Fall Precaution Comments: reviewed and covered initial exercises Required Braces or Orthoses: Knee Immobilizer - Left Restrictions LLE Weight Bearing: Weight bearing as tolerated    Mobility  Bed Mobility Overal bed mobility: Needs Assistance Bed Mobility: Supine to Sit     Supine to sit: Min assist;HOB elevated     General bed mobility comments: Min A to manage LLE off of bed.  Transfers Overall transfer level: Needs assistance Equipment used: Rolling walker (2 wheeled) Transfers: Sit to/from Stand Sit to Stand: Min assist         General transfer comment: Cues for hand placement. Instructed pt not to flop but to lower self gradually into stair when sitting.  Ambulation/Gait Ambulation/Gait assistance: Min guard Ambulation Distance (Feet): 75 Feet   Gait Pattern/deviations: Step-to pattern;Narrow base of support;Antalgic;Decreased step length - right;Decreased stance time - left;Decreased weight shift to left   Gait velocity interpretation: Below normal speed for age/gender General Gait Details: Cues for upright posture and to initiate L knee extension when in mid-stance.   Stairs            Wheelchair Mobility    Modified Rankin (Stroke Patients Only)       Balance                                     Cognition Arousal/Alertness: Awake/alert Behavior During Therapy: WFL for tasks assessed/performed Overall Cognitive Status: Within Functional Limits for tasks assessed                      Exercises Total Joint Exercises Quad Sets: AROM;Both;10 reps;Seated Hip ABduction/ADduction: AROM;10 reps;Left;Seated Long Arc Quad: AAROM;Left;5 reps;Seated    General Comments        Pertinent Vitals/Pain Pain Assessment: 0-10 Pain Score: 6  Pain Location: L thigh and knee Pain Descriptors / Indicators: Aching;Tightness Pain Intervention(s): Limited activity within patient's tolerance;Monitored during session;Repositioned    Home Living                      Prior Function            PT Goals (current goals can now be found in the care plan section) Progress towards PT goals: Progressing toward goals    Frequency  BID    PT Plan Current plan remains appropriate    Co-evaluation             End of Session Equipment Utilized During Treatment: Gait belt Activity Tolerance: Patient limited by pain Patient left: in chair;with call bell/phone within reach     Time: 1001-1031 PT Time Calculation (min) (ACUTE ONLY): 30 min  Charges:  G CodesNita Sells, SPTA 2015-01-17, 10:45 AM

## 2014-12-26 NOTE — Progress Notes (Signed)
Orthopedic Tech Progress Note Patient Details:  [name] Sep 20, 1955 [id number] On cpm at 7:30 pm Patient ID: [name], male   DOB: Jun 22, 1955, 59 y.o.   MRN: [id number]   [name] 12/26/2014, 7:29 PM

## 2014-12-26 NOTE — Clinical Documentation Improvement (Signed)
Operative Report indicates PREOPERATIVE DIAGNOSIS: Left knee primary osteoarthritis  Please clarify the correct lateral procedure on report.  Presently documented as "PROCEDURE: Computer-assist, right cemented total knee arthroplasty"   Thank you,  Andy Gauss ,RN Clinical Documentation Specialist:  [phone]  [facility]- Health Information Management

## 2014-12-26 NOTE — Progress Notes (Signed)
2:45 PM  Patient has decided to go home as he does not want SNF as he cannot pay for transportation and does not have the funds if SNF requires a co-pay. He is agreeable for Home health and then outpatient if needed. Wife in room and in agreement. DC home when medically cleared. SW signing off. Deretha Emory, MSW Clinical Social Work: Emergency Room [phone]   LCSW followed up with patient as now he has changed his mind regarding SNF. Patient agreeable for SNF in [place]. Patient begins asking questions regarding transportation to SNF.  Patient reporting he cannot pay for ambulance as he does not have the funds. Patient has 2 options: [place]: [facility] and [place]:  [facility]. Patient's referral can be sent for transport and skilled need, however there is no guaranteed payment unless insurance feels this was necessary means of transport.  For patient to make decision, LCSW obtained quoted prices for transport:  [place]: 901.11 [place]: 1141.99  Patient will most likely also have a co-pay with skilled facility and the possibility of denial if insurance feels patient can manage better at home.  This was voice to patient in effort to make decision.  Patient wife at the bedside and children also in the room that live with patient. He has support per wife. LCSW to follow up with choice.  Patient ready for DC today.  Deretha Emory, MSW Clinical Social Work: Emergency Room [phone]

## 2014-12-26 NOTE — Care Management Note (Addendum)
Case Management Note  Patient Details  Name: [name] MRN: [id number] Date of Birth: 04-Aug-1955  Subjective/Objective:     59 yr old male s/p left total knee arthroplasty.               Action/Plan: case manager spoke with patient again concerning change in discharge plan. Patient will go home with Home Health instead of SNF. Referral has been called to [place] Liaison. Walker, 3in1 have been delivered. Patient has family support at discharge.  Expected Discharge Date:   12/27/14             Expected Discharge Plan:  Home w Home Health Services  In-House Referral:  Clinical Social Work  Discharge planning Services     Post Acute Care Choice:  Durable Medical Equipment, Home Health Choice offered to:  NA, Patient  DME Arranged:  3-N-1, CPM, Walker tall DME Agency:  [employer]  HH Arranged:  NA, PT HH Agency:  [facility]  Status of Service:  Completed, signed off  Medicare Important Message Given:    Date Medicare IM Given:    Medicare IM give by:    Date Additional Medicare IM Given:    Additional Medicare Important Message give by:     If discussed at Long Length of Stay Meetings, dates discussed:    Additional Comments:  12/27/14 10:38am Patient is covered under [facility] services. Case manager is contacting his primary physician and social worker at [facility], to arrange for Home Health physical therapy [phone].  12/27/14  2:01pm 12/27/14  Case manager called [facility] call center, [phone] and sent message to Dr. Starlyn Skeans nurse- [employer], stating we need authorization for home health physical therapy.Huston Foley, Jacklynn Ganong, RN 12/26/2014, 3:07 PM

## 2014-12-27 LAB — CBC
HCT: 32.5 % — ABNORMAL LOW (ref 39.0–52.0)
HEMOGLOBIN: 10.8 g/dL — AB (ref 13.0–17.0)
MCH: 27.3 pg (ref 26.0–34.0)
MCHC: 33.2 g/dL (ref 30.0–36.0)
MCV: 82.3 fL (ref 78.0–100.0)
PLATELETS: 169 10*3/uL (ref 150–400)
RBC: 3.95 MIL/uL — ABNORMAL LOW (ref 4.22–5.81)
RDW: 13.7 % (ref 11.5–15.5)
WBC: 10.4 10*3/uL (ref 4.0–10.5)

## 2014-12-27 LAB — GLUCOSE, CAPILLARY
GLUCOSE-CAPILLARY: 136 mg/dL — AB (ref 65–99)
Glucose-Capillary: 140 mg/dL — ABNORMAL HIGH (ref 65–99)

## 2014-12-27 NOTE — Progress Notes (Signed)
Physical Therapy Treatment Patient Details Name: RAIDER VALBUENA MRN: [id number] DOB: 01-Feb-1956 Today's Date: 12/27/2014    History of Present Illness 59 yo male with OA L knee and TKA 12/24/14    PT Comments    Upon entering room pt stated he was fatigued but was willing to do therapy. Started to walk out into hall and pt stated that he was feeling light headed. Pt opted to turn back and go lay back in bed. Pt declined to do bed exercises at this time. Plan to do gait training and exercises next session. Continue with current POC. Patient is now planning to DC home instead of SNF. Updated POC.    Follow Up Recommendations  Home health PT     Equipment Recommendations  Rolling walker with 5" wheels    Recommendations for Other Services       Precautions / Restrictions Precautions Precautions: Knee;Fall Required Braces or Orthoses: Knee Immobilizer - Left Restrictions Weight Bearing Restrictions: Yes LLE Weight Bearing: Weight bearing as tolerated    Mobility  Bed Mobility Overal bed mobility: Modified Independent                Transfers Overall transfer level: Needs assistance Equipment used: Rolling walker (2 wheeled) Transfers: Sit to/from Stand Sit to Stand: Supervision         General transfer comment: Supervision for safety.  Ambulation/Gait Ambulation/Gait assistance: Min guard Ambulation Distance (Feet): 50 Feet Assistive device: Rolling walker (2 wheeled) Gait Pattern/deviations: Step-through pattern;Decreased stride length;Decreased weight shift to left   Gait velocity interpretation: Below normal speed for age/gender General Gait Details: Pt stated he was feeling light headed so gait distance was decreased. Overall gait was good. Min G for safety 2/2 pt not feeling well.   Stairs            Wheelchair Mobility    Modified Rankin (Stroke Patients Only)       Balance                                    Cognition  Arousal/Alertness: Awake/alert Behavior During Therapy: WFL for tasks assessed/performed Overall Cognitive Status: Within Functional Limits for tasks assessed                      Exercises      General Comments        Pertinent Vitals/Pain Pain Assessment: 0-10 Pain Score: 6  Pain Location: L shin. Pain Descriptors / Indicators: Aching;Sore;Constant Pain Intervention(s): Limited activity within patient's tolerance    Home Living                      Prior Function            PT Goals (current goals can now be found in the care plan section)      Frequency  BID    PT Plan Discharge plan needs to be updated    Co-evaluation             End of Session Equipment Utilized During Treatment: Gait belt Activity Tolerance: Patient limited by fatigue;Patient limited by lethargy;Other (comment) (Pt stated he was feeling tired and was light headed.) Patient left: in bed;with call bell/phone within reach     Time: 0852-0906 PT Time Calculation (min) (ACUTE ONLY): 14 min  Charges:  G CodesNita Sells, SPTA 01/17/2015, 9:22 AM

## 2014-12-27 NOTE — Progress Notes (Signed)
Subjective: 3 Days Post-Op Procedure(s) (LRB): COMPUTER ASSISTED TOTAL KNEE ARTHROPLASTY (Left) Patient reports pain as mild.    Objective: Vital signs in last 24 hours: Temp:  [97.5 F (36.4 C)-99 F (37.2 C)] 98 F (36.7 C) (08/04 0556) Pulse Rate:  [90-125] 97 (08/04 0556) Resp:  [18] 18 (08/04 0556) BP: (110-145)/(74-98) 110/74 mmHg (08/04 0556) SpO2:  [99 %-100 %] 100 % (08/04 0556)  Intake/Output from previous day: 08/03 0701 - 08/04 0700 In: 720 [P.O.:720] Out: 900 [Urine:900] Intake/Output this shift:     Recent Labs  12/25/14 0441 12/26/14 0830 12/27/14 0500  HGB 13.4 11.5* 10.8*    Recent Labs  12/26/14 0830 12/27/14 0500  WBC 11.5* PENDING  RBC 4.35 3.95*  HCT 35.0* 32.5*  PLT 169 169    Recent Labs  12/25/14 0441  NA 133*  K 4.5  CL 98*  CO2 26  BUN 9  CREATININE 1.17  GLUCOSE 195*  CALCIUM 9.0   No results for input(s): LABPT, INR in the last 72 hours.  Neurologically intact  Assessment/Plan: 3 Days Post-Op Procedure(s) (LRB): COMPUTER ASSISTED TOTAL KNEE ARTHROPLASTY (Left) Discharge home with home health after he does stairs this AM.   Hazely Sealey C 12/27/2014, 7:55 AM

## 2014-12-27 NOTE — Progress Notes (Signed)
Physical Therapy Treatment Patient Details Name: [name] MRN: [id number] DOB: 10-Feb-1956 Today's Date: 01/02/2015    History of Present Illness 59 yo male with OA L knee and TKA 12/24/14    PT Comments    Pt progressing well with rehab. Improved gait pattern to step-through. Pt stated that he had a ramp leading into the house and that he did not need to practice steps. Pt plans to D/C this afternoon. Continue to recommend HHPT for ongoing rehab to increase functional independence and safety.   Follow Up Recommendations  Home health PT     Equipment Recommendations  Rolling walker with 5" wheels    Recommendations for Other Services       Precautions / Restrictions Precautions Precautions: Knee;Fall Restrictions LLE Weight Bearing: Weight bearing as tolerated    Mobility  Bed Mobility               General bed mobility comments: Pt in bathroom upon entry.  Transfers Overall transfer level: Modified independent                  Ambulation/Gait Ambulation/Gait assistance: Supervision Ambulation Distance (Feet): 175 Feet Assistive device: Rolling walker (2 wheeled) Gait Pattern/deviations: Step-through pattern;Decreased stride length;Decreased weight shift to left   Gait velocity interpretation: Below normal speed for age/gender General Gait Details: Instructed pt on proper heel-strike and toe-off gait pattern and pt was able to demo step-through pattern.   Stairs            Wheelchair Mobility    Modified Rankin (Stroke Patients Only)       Balance                                    Cognition Arousal/Alertness: Awake/alert Behavior During Therapy: WFL for tasks assessed/performed Overall Cognitive Status: Within Functional Limits for tasks assessed                      Exercises Total Joint Exercises Quad Sets: AROM;Both;10 reps;Seated Heel Slides: AAROM;10 reps;Left;Seated Straight Leg Raises: AAROM;Left;10  reps;Seated Long Arc Quad: AAROM;Left;5 reps;Seated    General Comments        Pertinent Vitals/Pain Pain Assessment: 0-10 Pain Score: 2  Pain Location: LLE Pain Descriptors / Indicators: Sore Pain Intervention(s): Monitored during session;Repositioned    Home Living                      Prior Function            PT Goals (current goals can now be found in the care plan section) Progress towards PT goals: Progressing toward goals    Frequency  BID    PT Plan Current plan remains appropriate    Co-evaluation             End of Session Equipment Utilized During Treatment: Gait belt Activity Tolerance: Patient tolerated treatment well Patient left: in chair;with call bell/phone within reach;with family/visitor present     Time: 4098-1191 PT Time Calculation (min) (ACUTE ONLY): 21 min  Charges:  $Gait Training: 8-22 mins                    G Codes:      [name], SPTA 01-02-2015, 2:08 PM

## 2015-01-09 NOTE — Discharge Summary (Signed)
Patient ID: [name] MRN: [id number] DOB/AGE: 1955-09-28 59 y.o.  Admit date: 12/24/2014 Discharge date: 01/09/2015  Admission Diagnoses:  Active Problems:   Total knee replacement status   Discharge Diagnoses:  Active Problems:   Total knee replacement status  status post Procedure(s): COMPUTER ASSISTED TOTAL KNEE ARTHROPLASTY  Past Medical History  Diagnosis Date  . Hypertension   . Dysrhythmia     afib  . Sleep apnea     cpap for 2 yrs  . GERD (gastroesophageal reflux disease)   . Arthritis   . Complication of anesthesia     hx twice of tachy after surgery  . Refusal of blood transfusions as patient is Jehovah's Witness   . PONV (postoperative nausea and vomiting)   . 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 male who was admitted 12/24/2014 for operative treatment of left knee djd. Patient failed conservative treatments (please see the history and physical for the specifics) and had severe unremitting pain that affects sleep, daily activities and work/hobbies. After pre-op clearance, the patient was taken to the operating room on 12/24/2014 and underwent  Procedure(s): COMPUTER ASSISTED TOTAL KNEE ARTHROPLASTY.    Patient was given perioperative antibiotics:  Anti-infectives    Start     Dose/Rate Route Frequency Ordered Stop   12/24/14 1019  ceFAZolin (ANCEF) IVPB 2 g/50 mL premix  Status:  Discontinued     2 g 100 mL/hr over 30 Minutes Intravenous On call to O.R. 12/24/14 1019 12/24/14 1020   12/24/14 1000  clindamycin (CLEOCIN) IVPB 900 mg     900 mg 100 mL/hr over 30 Minutes Intravenous To [facility] 12/21/14 1115 12/24/14 1250       Patient was given sequential compression devices and early ambulation to prevent DVT.   Patient benefited maximally  from hospital stay and there were no complications. At the time of discharge, the patient was urinating/moving their bowels without difficulty, tolerating a regular diet, pain is controlled with oral pain medications and they have been cleared by PT/OT.   Recent vital signs: No data found.    Recent laboratory studies: No results for input(s): WBC, HGB, HCT, PLT, NA, K, CL, CO2, BUN, CREATININE, GLUCOSE, INR, CALCIUM in the last 72 hours.  Invalid input(s): PT, 2   Discharge Medications:     Medication List    STOP taking these medications        Fish Oil 1000 MG Caps      TAKE these medications        aspirin EC 325 MG tablet  Take 325 mg by mouth daily.     diltiazem 120 MG 24 hr capsule  Commonly known as:  DILACOR XR  Take 120 mg by mouth daily.     gabapentin 600 MG tablet  Commonly known as:  NEURONTIN  Take 600 mg by mouth 4 (four) times daily.     glipiZIDE 10 MG tablet  Commonly known as:  GLUCOTROL  Take 10 mg by mouth daily before breakfast.     insulin NPH Human 100 UNIT/ML injection  Commonly known as:  HUMULIN N,NOVOLIN N  Inject 10 Units into the skin at bedtime.     metFORMIN 850 MG tablet  Commonly known as:  GLUCOPHAGE  Take 850 mg by mouth 2 (  two) times daily with a meal.     methocarbamol 500 MG tablet  Commonly known as:  ROBAXIN  Take 1 tablet (500 mg total) by mouth every 6 (six) hours as needed for muscle spasms.     metoprolol 50 MG tablet  Commonly known as:  LOPRESSOR  Take 75 mg by mouth 2 (two) times daily.     omeprazole 20 MG capsule  Commonly known as:  PRILOSEC  Take 20 mg by mouth daily.     oxyCODONE-acetaminophen 10-325 MG per tablet  Commonly known as:  PERCOCET  Take 1 tablet by mouth every 6 (six) hours as needed.        Diagnostic Studies: Dg Chest 2 View  12/14/2014   CLINICAL DATA:  Preop for knee arthroplasty  EXAM: CHEST  2 VIEW  COMPARISON:  06/14/2008  FINDINGS: Borderline cardiomegaly. No acute infiltrate  or pleural effusion. No pulmonary edema. Degenerative changes lower thoracic spine.  IMPRESSION: No active disease.  Borderline cardiomegaly.   Electronically Signed   By: Natasha Mead M.D.   On: 12/14/2014 10:43          Follow-up Information    Schedule an appointment as soon as possible for a visit with Eldred Manges, MD.   Specialty:  Orthopedic Surgery   Why:  need return office visit 2 weeks postop   Contact information:   [address] [place] [address] [place] [address] [phone]       Discharge Plan:  discharge to home  Disposition:     Signed: Naida Sleight  01/09/2015, 4:28 PM

## 2015-05-27 ENCOUNTER — Emergency Department (HOSPITAL_COMMUNITY): Payer: Non-veteran care

## 2015-05-27 ENCOUNTER — Encounter (HOSPITAL_COMMUNITY): Payer: Self-pay | Admitting: Emergency Medicine

## 2015-05-27 ENCOUNTER — Inpatient Hospital Stay (HOSPITAL_COMMUNITY)
Admission: EM | Admit: 2015-05-27 | Discharge: 2015-06-11 | DRG: 023 | Disposition: A | Discharge status: Skilled Nursing Facility | Payer: Non-veteran care | Attending: Neurology | Admitting: Neurology

## 2015-05-27 ENCOUNTER — Inpatient Hospital Stay (HOSPITAL_COMMUNITY): Payer: Non-veteran care

## 2015-05-27 DIAGNOSIS — G919 Hydrocephalus, unspecified: Secondary | ICD-10-CM | POA: Diagnosis not present

## 2015-05-27 DIAGNOSIS — H53462 Homonymous bilateral field defects, left side: Secondary | ICD-10-CM | POA: Diagnosis present

## 2015-05-27 DIAGNOSIS — I4891 Unspecified atrial fibrillation: Secondary | ICD-10-CM | POA: Insufficient documentation

## 2015-05-27 DIAGNOSIS — I619 Nontraumatic intracerebral hemorrhage, unspecified: Secondary | ICD-10-CM | POA: Diagnosis not present

## 2015-05-27 DIAGNOSIS — D62 Acute posthemorrhagic anemia: Secondary | ICD-10-CM | POA: Diagnosis not present

## 2015-05-27 DIAGNOSIS — H5347 Heteronymous bilateral field defects: Secondary | ICD-10-CM | POA: Diagnosis present

## 2015-05-27 DIAGNOSIS — E785 Hyperlipidemia, unspecified: Secondary | ICD-10-CM | POA: Diagnosis present

## 2015-05-27 DIAGNOSIS — I10 Essential (primary) hypertension: Secondary | ICD-10-CM | POA: Diagnosis present

## 2015-05-27 DIAGNOSIS — I071 Rheumatic tricuspid insufficiency: Secondary | ICD-10-CM | POA: Diagnosis present

## 2015-05-27 DIAGNOSIS — G8194 Hemiplegia, unspecified affecting left nondominant side: Secondary | ICD-10-CM | POA: Diagnosis present

## 2015-05-27 DIAGNOSIS — I611 Nontraumatic intracerebral hemorrhage in hemisphere, cortical: Secondary | ICD-10-CM | POA: Diagnosis present

## 2015-05-27 DIAGNOSIS — Z794 Long term (current) use of insulin: Secondary | ICD-10-CM | POA: Diagnosis not present

## 2015-05-27 DIAGNOSIS — I509 Heart failure, unspecified: Secondary | ICD-10-CM

## 2015-05-27 DIAGNOSIS — I618 Other nontraumatic intracerebral hemorrhage: Secondary | ICD-10-CM | POA: Diagnosis not present

## 2015-05-27 DIAGNOSIS — R42 Dizziness and giddiness: Secondary | ICD-10-CM | POA: Diagnosis present

## 2015-05-27 DIAGNOSIS — M1990 Unspecified osteoarthritis, unspecified site: Secondary | ICD-10-CM | POA: Diagnosis present

## 2015-05-27 DIAGNOSIS — H5442 Blindness, left eye, normal vision right eye: Secondary | ICD-10-CM | POA: Diagnosis present

## 2015-05-27 DIAGNOSIS — K219 Gastro-esophageal reflux disease without esophagitis: Secondary | ICD-10-CM | POA: Diagnosis present

## 2015-05-27 DIAGNOSIS — M25572 Pain in left ankle and joints of left foot: Secondary | ICD-10-CM | POA: Diagnosis not present

## 2015-05-27 DIAGNOSIS — R112 Nausea with vomiting, unspecified: Secondary | ICD-10-CM | POA: Diagnosis not present

## 2015-05-27 DIAGNOSIS — G936 Cerebral edema: Secondary | ICD-10-CM | POA: Diagnosis not present

## 2015-05-27 DIAGNOSIS — I69398 Other sequelae of cerebral infarction: Secondary | ICD-10-CM

## 2015-05-27 DIAGNOSIS — K5900 Constipation, unspecified: Secondary | ICD-10-CM | POA: Insufficient documentation

## 2015-05-27 DIAGNOSIS — G4733 Obstructive sleep apnea (adult) (pediatric): Secondary | ICD-10-CM | POA: Diagnosis present

## 2015-05-27 DIAGNOSIS — Z96652 Presence of left artificial knee joint: Secondary | ICD-10-CM | POA: Diagnosis present

## 2015-05-27 DIAGNOSIS — Z9889 Other specified postprocedural states: Secondary | ICD-10-CM

## 2015-05-27 DIAGNOSIS — Z7982 Long term (current) use of aspirin: Secondary | ICD-10-CM

## 2015-05-27 DIAGNOSIS — I6789 Other cerebrovascular disease: Secondary | ICD-10-CM | POA: Diagnosis not present

## 2015-05-27 DIAGNOSIS — I481 Persistent atrial fibrillation: Secondary | ICD-10-CM | POA: Diagnosis not present

## 2015-05-27 DIAGNOSIS — Z8673 Personal history of transient ischemic attack (TIA), and cerebral infarction without residual deficits: Secondary | ICD-10-CM | POA: Diagnosis not present

## 2015-05-27 DIAGNOSIS — R001 Bradycardia, unspecified: Secondary | ICD-10-CM | POA: Diagnosis not present

## 2015-05-27 DIAGNOSIS — E119 Type 2 diabetes mellitus without complications: Secondary | ICD-10-CM | POA: Diagnosis present

## 2015-05-27 DIAGNOSIS — Z531 Procedure and treatment not carried out because of patient's decision for reasons of belief and group pressure: Secondary | ICD-10-CM | POA: Diagnosis present

## 2015-05-27 DIAGNOSIS — Z789 Other specified health status: Secondary | ICD-10-CM

## 2015-05-27 DIAGNOSIS — Z7901 Long term (current) use of anticoagulants: Secondary | ICD-10-CM | POA: Diagnosis not present

## 2015-05-27 DIAGNOSIS — I63531 Cerebral infarction due to unspecified occlusion or stenosis of right posterior cerebral artery: Principal | ICD-10-CM | POA: Diagnosis present

## 2015-05-27 DIAGNOSIS — I6930 Unspecified sequelae of cerebral infarction: Secondary | ICD-10-CM | POA: Diagnosis not present

## 2015-05-27 DIAGNOSIS — T45515A Adverse effect of anticoagulants, initial encounter: Secondary | ICD-10-CM | POA: Diagnosis present

## 2015-05-27 DIAGNOSIS — I63431 Cerebral infarction due to embolism of right posterior cerebral artery: Secondary | ICD-10-CM | POA: Diagnosis not present

## 2015-05-27 DIAGNOSIS — R52 Pain, unspecified: Secondary | ICD-10-CM

## 2015-05-27 DIAGNOSIS — Z23 Encounter for immunization: Secondary | ICD-10-CM | POA: Diagnosis present

## 2015-05-27 DIAGNOSIS — I615 Nontraumatic intracerebral hemorrhage, intraventricular: Secondary | ICD-10-CM | POA: Diagnosis not present

## 2015-05-27 DIAGNOSIS — K5909 Other constipation: Secondary | ICD-10-CM | POA: Diagnosis not present

## 2015-05-27 DIAGNOSIS — I482 Chronic atrial fibrillation: Secondary | ICD-10-CM | POA: Diagnosis not present

## 2015-05-27 HISTORY — DX: Cerebral infarction, unspecified: I63.9

## 2015-05-27 LAB — URINALYSIS, ROUTINE W REFLEX MICROSCOPIC
Bilirubin Urine: NEGATIVE
GLUCOSE, UA: NEGATIVE mg/dL
HGB URINE DIPSTICK: NEGATIVE
KETONES UR: NEGATIVE mg/dL
LEUKOCYTES UA: NEGATIVE
Nitrite: NEGATIVE
PROTEIN: NEGATIVE mg/dL
Specific Gravity, Urine: 1.006 (ref 1.005–1.030)
pH: 7.5 (ref 5.0–8.0)

## 2015-05-27 LAB — COMPREHENSIVE METABOLIC PANEL
ALBUMIN: 3.8 g/dL (ref 3.5–5.0)
ALT: 16 U/L — AB (ref 17–63)
AST: 21 U/L (ref 15–41)
Alkaline Phosphatase: 69 U/L (ref 38–126)
Anion gap: 13 (ref 5–15)
BILIRUBIN TOTAL: 1 mg/dL (ref 0.3–1.2)
BUN: 9 mg/dL (ref 6–20)
CO2: 25 mmol/L (ref 22–32)
CREATININE: 0.85 mg/dL (ref 0.61–1.24)
Calcium: 10 mg/dL (ref 8.9–10.3)
Chloride: 101 mmol/L (ref 101–111)
GFR calc Af Amer: >60 mL/min (ref >60)
GFR calc non Af Amer: >60 mL/min (ref >60)
GLUCOSE: 183 mg/dL — AB (ref 65–99)
POTASSIUM: 4.1 mmol/L (ref 3.5–5.1)
Sodium: 139 mmol/L (ref 135–145)
TOTAL PROTEIN: 7.1 g/dL (ref 6.5–8.1)

## 2015-05-27 LAB — DIFFERENTIAL
BASOS ABS: 0 10*3/uL (ref 0.0–0.1)
Basophils Relative: 0 %
EOS ABS: 0 10*3/uL (ref 0.0–0.7)
Eosinophils Relative: 1 %
LYMPHS ABS: 1.7 10*3/uL (ref 0.7–4.0)
LYMPHS PCT: 21 %
MONOS PCT: 10 %
Monocytes Absolute: 0.8 10*3/uL (ref 0.1–1.0)
NEUTROS ABS: 5.9 10*3/uL (ref 1.7–7.7)
NEUTROS PCT: 68 %

## 2015-05-27 LAB — I-STAT CHEM 8, ED
BUN: 11 mg/dL (ref 6–20)
CREATININE: 0.7 mg/dL (ref 0.61–1.24)
Calcium, Ion: 1.17 mmol/L (ref 1.13–1.30)
Chloride: 100 mmol/L — ABNORMAL LOW (ref 101–111)
GLUCOSE: 178 mg/dL — AB (ref 65–99)
HEMATOCRIT: 49 % (ref 39.0–52.0)
Hemoglobin: 16.7 g/dL (ref 13.0–17.0)
POTASSIUM: 4 mmol/L (ref 3.5–5.1)
Sodium: 140 mmol/L (ref 135–145)
TCO2: 25 mmol/L (ref 0–100)

## 2015-05-27 LAB — CBC
HEMATOCRIT: 42 % (ref 39.0–52.0)
HEMOGLOBIN: 13.3 g/dL (ref 13.0–17.0)
MCH: 24.4 pg — ABNORMAL LOW (ref 26.0–34.0)
MCHC: 31.7 g/dL (ref 30.0–36.0)
MCV: 77.2 fL — ABNORMAL LOW (ref 78.0–100.0)
Platelets: 372 10*3/uL (ref 150–400)
RBC: 5.44 MIL/uL (ref 4.22–5.81)
RDW: 14.1 % (ref 11.5–15.5)
WBC: 8.5 10*3/uL (ref 4.0–10.5)

## 2015-05-27 LAB — HEPARIN LEVEL (UNFRACTIONATED): HEPARIN UNFRACTIONATED: 0.91 [IU]/mL — AB (ref 0.30–0.70)

## 2015-05-27 LAB — I-STAT TROPONIN, ED: Troponin i, poc: 0.01 ng/mL (ref 0.00–0.08)

## 2015-05-27 LAB — APTT: APTT: 28 s (ref 24–37)

## 2015-05-27 LAB — PROTIME-INR
INR: 1.34 (ref 0.00–1.49)
Prothrombin Time: 16.7 seconds — ABNORMAL HIGH (ref 11.6–15.2)

## 2015-05-27 LAB — RAPID URINE DRUG SCREEN, HOSP PERFORMED
Amphetamines: NOT DETECTED
BARBITURATES: NOT DETECTED
BENZODIAZEPINES: NOT DETECTED
COCAINE: NOT DETECTED
Opiates: POSITIVE — AB
TETRAHYDROCANNABINOL: NOT DETECTED

## 2015-05-27 LAB — GLUCOSE, CAPILLARY: GLUCOSE-CAPILLARY: 159 mg/dL — AB (ref 65–99)

## 2015-05-27 LAB — ETHANOL: Alcohol, Ethyl (B): <5 mg/dL (ref <5)

## 2015-05-27 MED ORDER — METOPROLOL TARTRATE 1 MG/ML IV SOLN
5.0000 mg | INTRAVENOUS | Status: DC | PRN
  Administered 2015-05-27: 5 mg via INTRAVENOUS

## 2015-05-27 MED ORDER — SODIUM CHLORIDE 0.9 % IV BOLUS (SEPSIS)
1000.0000 mL | Freq: Once | INTRAVENOUS | Status: AC
  Administered 2015-05-27: 1000 mL via INTRAVENOUS

## 2015-05-27 MED ORDER — METOPROLOL TARTRATE 1 MG/ML IV SOLN
INTRAVENOUS | Status: AC
  Filled 2015-05-27: qty 5

## 2015-05-27 MED ORDER — METOPROLOL TARTRATE 1 MG/ML IV SOLN: 2.5000 mg | INTRAVENOUS | Status: DC | PRN

## 2015-05-27 MED ORDER — MORPHINE SULFATE (PF) 4 MG/ML IV SOLN
4.0000 mg | Freq: Once | INTRAVENOUS | Status: AC
  Administered 2015-05-27: 4 mg via INTRAVENOUS
  Filled 2015-05-27: qty 1

## 2015-05-27 MED ORDER — PNEUMOCOCCAL VAC POLYVALENT 25 MCG/0.5ML IJ INJ
0.5000 mL | INJECTION | INTRAMUSCULAR | Status: AC
  Administered 2015-05-28: 0.5 mL via INTRAMUSCULAR
  Filled 2015-05-27: qty 0.5

## 2015-05-27 MED ORDER — IOHEXOL 350 MG/ML SOLN
100.0000 mL | Freq: Once | INTRAVENOUS | Status: AC | PRN
  Administered 2015-05-27: 80 mL via INTRAVENOUS

## 2015-05-27 MED ORDER — METOPROLOL TARTRATE 1 MG/ML IV SOLN
2.5000 mg | INTRAVENOUS | Status: DC | PRN
  Administered 2015-05-28: 5 mg via INTRAVENOUS
  Filled 2015-05-27: qty 5

## 2015-05-27 MED ORDER — INSULIN ASPART 100 UNIT/ML ~~LOC~~ SOLN
0.0000 [IU] | SUBCUTANEOUS | Status: DC
  Administered 2015-05-27 – 2015-05-28 (×3): 2 [IU] via SUBCUTANEOUS
  Administered 2015-05-28: 4 [IU] via SUBCUTANEOUS
  Administered 2015-05-28: 2 [IU] via SUBCUTANEOUS

## 2015-05-27 MED ORDER — PROTHROMBIN COMPLEX CONC HUMAN 500 UNITS IV KIT
4500.0000 [IU] | PACK | Status: AC
  Administered 2015-05-27: 4500 [IU] via INTRAVENOUS
  Filled 2015-05-27: qty 180

## 2015-05-27 MED ORDER — NICARDIPINE HCL IN NACL 20-0.86 MG/200ML-% IV SOLN
3.0000 mg/h | INTRAVENOUS | Status: DC
  Administered 2015-05-27: 7.5 mg/h via INTRAVENOUS
  Administered 2015-05-27: 5 mg/h via INTRAVENOUS
  Filled 2015-05-27 (×3): qty 200

## 2015-05-27 MED ORDER — ONDANSETRON HCL 4 MG/2ML IJ SOLN
4.0000 mg | Freq: Once | INTRAMUSCULAR | Status: AC
  Administered 2015-05-27: 4 mg via INTRAVENOUS
  Filled 2015-05-27: qty 2

## 2015-05-27 NOTE — ED Provider Notes (Signed)
CSN: [id number]     Arrival date & time 05/27/15  1522 History   First MD Initiated Contact with Patient 05/27/15 1554     Chief Complaint  Patient presents with  . Dizziness  . Nausea     (Consider location/radiation/quality/duration/timing/severity/associated sxs/prior Treatment) HPI   60 year old male with hx of insulin dependent diabetes, afib, HTN recent stroke 1.5 weeks ago with resolved L eye blindness brought here via EMS from home for evaluation of dizziness.  Pt report he was fine yesterday, but this morning when he got out of bed he felt light headedness, endorse R sided throbbing headache of moderate/severe intensity that has been persistent.  Endorse nausea and generalized weakness.  Sxs worsening with positional changes.   No specific treatment tried.  He denies fever, double vision, URI sxs, sinus drainage, neck stiffness, cp, sob, heart palpitation, abd pain, v/d, dysuria, focal numbness or weakness.  Denies vertiginous sxs.  He was started on Eliquis when discharged from hospital 1-2 weeks ago.  PCP is at [place].   Past Medical History  Diagnosis Date  . Hypertension   . Dysrhythmia     afib  . Sleep apnea     cpap for 2 yrs  . GERD (gastroesophageal reflux disease)   . Arthritis   . Complication of anesthesia     hx twice of tachy after surgery  . Refusal of blood transfusions as patient is Jehovah's Witness   . PONV (postoperative nausea and vomiting)   . 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 Stroke [facility])    Past Surgical History  Procedure Laterality Date  . Lithotriopsy  15    ? stone -could not find  . Joint replacement Right 10    knee  . Back surgery      x4 2 diskectomies -2 fusions last 08  . Knee arthroplasty Left 12/24/2014    Procedure: COMPUTER ASSISTED TOTAL KNEE ARTHROPLASTY;  Surgeon: Eldred Manges, MD;  Location: [facility] OR;  Service: Orthopedics;  Laterality: Left;  . Total knee arthroplasty Left 12/24/2014   No family  history on file. Social History  Substance Use Topics  . Smoking status: Never Smoker   . Smokeless tobacco: Never Used  . Alcohol Use: Yes     Comment: social    Review of Systems  All other systems reviewed and are negative.     Allergies  Penicillins  Home Medications   Prior to Admission medications   Medication Sig Start Date End Date Taking? Authorizing Provider  aspirin EC 325 MG tablet Take 325 mg by mouth daily.    Historical Provider, MD  diltiazem (DILACOR XR) 120 MG 24 hr capsule Take 120 mg by mouth daily.    Historical Provider, MD  gabapentin (NEURONTIN) 600 MG tablet Take 600 mg by mouth 4 (four) times daily.    Historical Provider, MD  glipiZIDE (GLUCOTROL) 10 MG tablet Take 10 mg by mouth daily before breakfast.    Historical Provider, MD  insulin NPH Human (HUMULIN N,NOVOLIN N) 100 UNIT/ML injection Inject 10 Units into the skin at bedtime.    Historical Provider, MD  metFORMIN (GLUCOPHAGE) 850 MG tablet Take 850 mg by mouth 2 (two) times daily with a meal.    Historical Provider, MD  methocarbamol (ROBAXIN) 500 MG tablet Take 1 tablet (500 mg total) by mouth every 6 (six) hours as needed for muscle spasms. 12/26/14   Naida Sleight, PA-C  metoprolol (  LOPRESSOR) 50 MG tablet Take 75 mg by mouth 2 (two) times daily.    Historical Provider, MD  omeprazole (PRILOSEC) 20 MG capsule Take 20 mg by mouth daily.    Historical Provider, MD  oxyCODONE-acetaminophen (PERCOCET) 10-325 MG per tablet Take 1 tablet by mouth every 6 (six) hours as needed. 12/26/14   Naida SleightJames M Owens, PA-C   BP 143/100 mmHg  Pulse 94  Temp(Src) 98.2 F (36.8 C) (Oral)  Resp 22  SpO2 99% Physical Exam  Constitutional: He appears well-developed and well-nourished. No distress.  AAM, non toxic in appearance.    HENT:  Head: Atraumatic.  Right Ear: External ear normal.  Left Ear: External ear normal.  Mouth/Throat: Oropharynx is clear and moist.  Eyes: Conjunctivae and EOM are normal. Pupils are  equal, round, and reactive to light.  Neck: Normal range of motion. Neck supple.  No nuchal rigidity  Cardiovascular:  Irregularly irregular and tachycardic  Pulmonary/Chest: Effort normal and breath sounds normal.  Abdominal: Soft. There is no tenderness.  Musculoskeletal: He exhibits no edema.  Neurological: He is alert.  Neurologic exam:  Speech clear, pupils equal round reactive to light, extraocular movements intact  Poor visual field tracking, left bilateral hemianopsia Cranial nerves III through XII normal including no facial droop Follows commands, moves all extremities x4, normal strength to bilateral upper and lower extremities at all major muscle groups including grip Sensation normal to light touch  Poor coordination with L side neglect, no limb ataxia,  Rapid alternating movements diminished No pronator drift Gait not tested.     Skin: No rash noted.  Psychiatric: He has a normal mood and affect.  Nursing note and vitals reviewed.   ED Course  Procedures (including critical care time) Labs Review Labs Reviewed  PROTIME-INR - Abnormal; Notable for the following:    Prothrombin Time 16.7 (*)    All other components within normal limits  CBC - Abnormal; Notable for the following:    MCV 77.2 (*)    MCH 24.4 (*)    All other components within normal limits  COMPREHENSIVE METABOLIC PANEL - Abnormal; Notable for the following:    Glucose, Bld 183 (*)    ALT 16 (*)    All other components within normal limits  I-STAT CHEM 8, ED - Abnormal; Notable for the following:    Chloride 100 (*)    Glucose, Bld 178 (*)    All other components within normal limits  ETHANOL  APTT  DIFFERENTIAL  URINE RAPID DRUG SCREEN, HOSP PERFORMED  URINALYSIS, ROUTINE W REFLEX MICROSCOPIC (NOT AT [facility])  HEPARIN LEVEL (UNFRACTIONATED)  I-STAT TROPOININ, ED  TYPE AND SCREEN    Imaging Review Ct Head Wo Contrast  05/27/2015  CLINICAL DATA:  Stroke last week with headache. History of  atrial fibrillation. EXAM: CT HEAD WITHOUT CONTRAST TECHNIQUE: Contiguous axial images were obtained from the base of the skull through the vertex without intravenous contrast. COMPARISON:  None. FINDINGS: Sinuses/Soft tissues: Clear paranasal sinuses and mastoid air cells. Intracranial: Mildly age advanced cerebral atrophy. Hemorrhage centered in the right PCA distribution, likely at the site of a subacute infarct. Hemorrhage measures 7.3 x 5.0 cm on image 17/series 2. Extension into the right lateral ventricle. Mass effect, with approximately 7 mm of right-to-left midline shift. Basal cisterns remain patent. There is prominence of the right temporal horn, suggesting developing hydrocephalus. No new infarct identified. IMPRESSION: Right parietal/occipital lobe hemorrhage, likely due to hemorrhagic transformation of a right PCA distribution infarct. Mass  effect with extension of hemorrhage into the right lateral ventricle and probable developing hydrocephalus. Critical test results telephoned to . Dr. Hyacinth Meeker. at the time of interpretation at 4:58 p.m.on 05/27/2015. Electronically Signed   By: Jeronimo Greaves M.D.   On: 05/27/2015 17:01   I have personally reviewed and evaluated these images and lab results as part of my medical decision-making.   EKG Interpretation   Date/Time:  Monday May 27 2015 15:51:41 EST Ventricular Rate:  100 PR Interval:    QRS Duration: 74 QT Interval:  343 QTC Calculation: 442 R Axis:   41 Text Interpretation:  Atrial fibrillation Borderline ST elevation,  anterior leads since last tracing no significant change other than rate  faster Abnormal ekg Confirmed by MILLER  MD, BRIAN ([id number]) on 05/27/2015  4:09:55 PM      MDM   Final diagnoses:  Hemorrhagic stroke (HCC)    BP 143/100 mmHg  Pulse 94  Temp(Src) 98.2 F (36.8 C) (Oral)  Resp 22  Ht 6\' 3"  (1.905 m)  Wt 97.523 kg  BMI 26.87 kg/m2  SpO2 99%   4:18 PM Pt with hx of recent stroke here with headache  and lightheadedness.  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  He does have a L sided hemianopsia on exam.  He was placed on Eliquis after diagnosed with recent stroke due to having atrial fibrilation, which thought to be responsible for his ischemic stroke. Last normal was last night. Given his c/o headache and on Elequis, Will obtain head CT scan to r/o hemorrhagic stroke and initiate work up.    5:00 PM Radiologist called to notified pt's head CT demonstrates a large hemorrhagic stroke with midline shift.  Neurology and neurosurgeon was promptly consulted.  Neurosurgery request neurology admit and will be available for consultation.  We also consult with pharmacy who recommend Kcentra as reversal agent.  Care discussed with Dr. Hyacinth Meeker.    5:09 PM Pt currently protecting his airway.  He is resting comfortably and stable at this time.  Kcentra initiated.  Cardene drip ordered.  Neurologist request medicine admission.  Dr. Hyacinth Meeker has consulted with [facility] Dr. Ella Jubilee who agrees to be available for consultation but request neurology to admit pt.  Pt currently also receiving morphine and zofran for his headache.  Pt is made aware of finding through conversation with Dr. Hyacinth Meeker.    CRITICAL CARE Performed by: Fayrene Helper Total critical care time: 60 minutes Critical care time was exclusive of separately billable procedures and treating other patients. Critical care was necessary to treat or prevent imminent or life-threatening deterioration. Critical care was time spent personally by me on the following activities: development of treatment plan with patient and/or surrogate as well as nursing, discussions with consultants, evaluation of patient's response to treatment, examination of patient, obtaining history from patient or surrogate, ordering and performing treatments and interventions, ordering and review of laboratory studies, ordering  and review of radiographic studies, pulse oximetry and re-evaluation of patient's condition.   Fayrene Helper, PA-C 05/27/15 1723  Fayrene Helper, PA-C 05/27/15 1825  Eber Hong, MD 05/28/15 [phone]

## 2015-05-27 NOTE — Consult Note (Signed)
Reason for Consult: Right parieto-occipital intracerebral hemorrhage Referring Physician: Dr. Maxie Barb is an 60 y.o. male.  HPI: Patient is a 60 year old right-handed individual who has had a history of atrial fibrillation for 2 years. He's been on a blood thinner. He has developed a significant intracerebral hemorrhage in the right parieto-occipital region. He has a left-sided visual field cut. He also has some mild left-sided weakness in the face and also in the left arm and left leg. His level of consciousness is otherwise good. He is being admitted by the stroke service for observation however because of the size of the hemorrhage and the potential for neurologic deterioration was asked to see the patient.  Past Medical History  Diagnosis Date  . Hypertension   . Dysrhythmia     afib  . Sleep apnea     cpap for 2 yrs  . GERD (gastroesophageal reflux disease)   . Arthritis   . Complication of anesthesia     hx twice of tachy after surgery  . Refusal of blood transfusions as patient is Jehovah's Witness   . PONV (postoperative nausea and vomiting)   . 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 Stroke [facility])     Past Surgical History  Procedure Laterality Date  . Lithotriopsy  15    ? stone -could not find  . Joint replacement Right 10    knee  . Back surgery      x4 2 diskectomies -2 fusions last 08  . Knee arthroplasty Left 12/24/2014    Procedure: COMPUTER ASSISTED TOTAL KNEE ARTHROPLASTY;  Surgeon: Marybelle Killings, MD;  Location: [place];  Service: Orthopedics;  Laterality: Left;  . Total knee arthroplasty Left 12/24/2014    No family history on file.  Social History:  reports that he has never smoked. He has never used smokeless tobacco. He reports that he drinks alcohol. He reports that he does not use illicit drugs.  Allergies:  Allergies  Allergen Reactions  . Penicillins Swelling    Tongue swells    Medications: I have  reviewed the patient's current medications.  Results for orders placed or performed during the hospital encounter of 05/27/15 (from the past 48 hour(s))  Ethanol     Status: None   Collection Time: 05/27/15  4:30 PM  Result Value Ref Range   Alcohol, Ethyl (B) <5 <5 mg/dL    Comment:        LOWEST DETECTABLE LIMIT FOR SERUM ALCOHOL IS 5 mg/dL FOR MEDICAL PURPOSES ONLY   Protime-INR     Status: Abnormal   Collection Time: 05/27/15  4:30 PM  Result Value Ref Range   Prothrombin Time 16.7 (H) 11.6 - 15.2 seconds   INR 1.34 0.00 - 1.49  APTT     Status: None   Collection Time: 05/27/15  4:30 PM  Result Value Ref Range   aPTT 28 24 - 37 seconds  CBC     Status: Abnormal   Collection Time: 05/27/15  4:30 PM  Result Value Ref Range   WBC 8.5 4.0 - 10.5 K/uL   RBC 5.44 4.22 - 5.81 MIL/uL   Hemoglobin 13.3 13.0 - 17.0 g/dL   HCT 42.0 39.0 - 52.0 %   MCV 77.2 (L) 78.0 - 100.0 fL   MCH 24.4 (L) 26.0 - 34.0 pg   MCHC 31.7 30.0 - 36.0 g/dL   RDW 14.1 11.5 - 15.5 %   Platelets 372 150 -  400 K/uL  Differential     Status: None   Collection Time: 05/27/15  4:30 PM  Result Value Ref Range   Neutrophils Relative % 68 %   Neutro Abs 5.9 1.7 - 7.7 K/uL   Lymphocytes Relative 21 %   Lymphs Abs 1.7 0.7 - 4.0 K/uL   Monocytes Relative 10 %   Monocytes Absolute 0.8 0.1 - 1.0 K/uL   Eosinophils Relative 1 %   Eosinophils Absolute 0.0 0.0 - 0.7 K/uL   Basophils Relative 0 %   Basophils Absolute 0.0 0.0 - 0.1 K/uL  Comprehensive metabolic panel     Status: Abnormal   Collection Time: 05/27/15  4:30 PM  Result Value Ref Range   Sodium 139 135 - 145 mmol/L   Potassium 4.1 3.5 - 5.1 mmol/L   Chloride 101 101 - 111 mmol/L   CO2 25 22 - 32 mmol/L   Glucose, Bld 183 (H) 65 - 99 mg/dL   BUN 9 6 - 20 mg/dL   Creatinine, Ser 0.85 0.61 - 1.24 mg/dL   Calcium 10.0 8.9 - 10.3 mg/dL   Total Protein 7.1 6.5 - 8.1 g/dL   Albumin 3.8 3.5 - 5.0 g/dL   AST 21 15 - 41 U/L   ALT 16 (L) 17 - 63 U/L    Alkaline Phosphatase 69 38 - 126 U/L   Total Bilirubin 1.0 0.3 - 1.2 mg/dL   GFR calc non Af Amer >60 >60 mL/min   GFR calc Af Amer >60 >60 mL/min    Comment: (NOTE) The eGFR has been calculated using the CKD EPI equation. This calculation has not been validated in all clinical situations. eGFR's persistently <60 mL/min signify possible Chronic Kidney Disease.    Anion gap 13 5 - 15  I-stat troponin, ED (not at [facility], [facility])     Status: None   Collection Time: 05/27/15  4:40 PM  Result Value Ref Range   Troponin i, poc 0.01 0.00 - 0.08 ng/mL   Comment 3            Comment: Due to the release kinetics of cTnI, a negative result within the first hours of the onset of symptoms does not rule out myocardial infarction with certainty. If myocardial infarction is still suspected, repeat the test at appropriate intervals.   I-Stat Chem 8, ED  (not at [facility], [facility])     Status: Abnormal   Collection Time: 05/27/15  4:43 PM  Result Value Ref Range   Sodium 140 135 - 145 mmol/L   Potassium 4.0 3.5 - 5.1 mmol/L   Chloride 100 (L) 101 - 111 mmol/L   BUN 11 6 - 20 mg/dL   Creatinine, Ser 0.70 0.61 - 1.24 mg/dL   Glucose, Bld 178 (H) 65 - 99 mg/dL   Calcium, Ion 1.17 1.13 - 1.30 mmol/L   TCO2 25 0 - 100 mmol/L   Hemoglobin 16.7 13.0 - 17.0 g/dL   HCT 49.0 39.0 - 52.0 %  Type and screen [place]     Status: None (Preliminary result)   Collection Time: 05/27/15  5:53 PM  Result Value Ref Range   ABO/RH(D) PENDING    Antibody Screen PENDING    Sample Expiration 05/30/2015   Heparin level - if patient on rivaroxaban (XARELTO) or apixaban [facility] [place])     Status: Abnormal   Collection Time: 05/27/15  6:04 PM  Result Value Ref Range   Heparin Unfractionated 0.91 (H) 0.30 - 0.70 IU/mL  Comment:        IF HEPARIN RESULTS ARE BELOW EXPECTED VALUES, AND PATIENT DOSAGE HAS BEEN CONFIRMED, SUGGEST FOLLOW UP TESTING OF ANTITHROMBIN III LEVELS.     Ct Head Wo  Contrast  05/27/2015  CLINICAL DATA:  Stroke last week with headache. History of atrial fibrillation. EXAM: CT HEAD WITHOUT CONTRAST TECHNIQUE: Contiguous axial images were obtained from the base of the skull through the vertex without intravenous contrast. COMPARISON:  None. FINDINGS: Sinuses/Soft tissues: Clear paranasal sinuses and mastoid air cells. Intracranial: Mildly age advanced cerebral atrophy. Hemorrhage centered in the right PCA distribution, likely at the site of a subacute infarct. Hemorrhage measures 7.3 x 5.0 cm on image 17/series 2. Extension into the right lateral ventricle. Mass effect, with approximately 7 mm of right-to-left midline shift. Basal cisterns remain patent. There is prominence of the right temporal horn, suggesting developing hydrocephalus. No new infarct identified. IMPRESSION: Right parietal/occipital lobe hemorrhage, likely due to hemorrhagic transformation of a right PCA distribution infarct. Mass effect with extension of hemorrhage into the right lateral ventricle and probable developing hydrocephalus. Critical test results telephoned to . Dr. Sabra Heck. at the time of interpretation at 4:58 p.m.on 05/27/2015. Electronically Signed   By: Abigail Miyamoto M.D.   On: 05/27/2015 17:01    Review of Systems  Eyes:       Visual field loss in the left visual field  Respiratory: Negative.   Cardiovascular: Negative.   Gastrointestinal: Negative.   Genitourinary: Negative.   Musculoskeletal: Negative.   Skin: Negative.   Neurological: Positive for headaches.  Endo/Heme/Allergies: Negative.   Psychiatric/Behavioral: Negative.    Blood pressure 150/85, pulse 260, temperature 98.2 F (36.8 C), temperature source Oral, resp. rate 15, height '6\' 3"'$  (1.905 m), weight 97.523 kg (215 lb), SpO2 100 %. Physical Exam  Constitutional: He is oriented to person, place, and time. He appears well-developed and well-nourished.  HENT:  Head: Normocephalic and atraumatic.  Eyes: EOM are  normal. Pupils are equal, round, and reactive to light.  Neck: Normal range of motion. Neck supple.  GI: Soft. Bowel sounds are normal.  Neurological: He is alert and oriented to person, place, and time.  Mild left-sided weakness in the upper and lower extremity. Left-sided hemianopsia. Left-sided neglect.  Skin: Skin is warm and dry.  Psychiatric: He has a normal mood and affect. His behavior is normal. Judgment and thought content normal.    Assessment/Plan: Right parieto-occipital intracerebral hemorrhage measuring at least 5 cm in maximum diameter. Modest mass effect on the surrounding brain.  Plan is to observe the patient clinically for the current time. Should his neurologic status deteriorated he may ultimately require surgical intervention. I discussed this with the patient and the family and I noted that at this time there is nothing to be gained by anticipating surgical intervention now and I'm hopeful that if this condition stabilizes he'll be treated successfully without the need to resort to surgical intervention.  Breydan Shillingburg J 05/27/2015, 6:57 PM

## 2015-05-27 NOTE — Progress Notes (Addendum)
Pt heart rate increasing >170 sustaining. Pt resting at this time. Dr. Cyril Mourningamillo notified. Orders received to give Lopressor 5mg  Q645min PRN at this time. Verified dosage and frequency with pharmacy. Pt's HR now 102 post Lopressor administration.

## 2015-05-27 NOTE — Progress Notes (Signed)
Dr. Cyril Mourningamillo on unit. Updated him about patients HR between mid 120s- 140s. No new orders at this time. Will continue to monitor.

## 2015-05-27 NOTE — ED Notes (Signed)
Dr. Elsner at bedside. 

## 2015-05-27 NOTE — ED Notes (Signed)
Unable to complete Stroke Swallow Screen in ED.  Accepting RN notified.

## 2015-05-27 NOTE — H&P (Addendum)
Chief Complaint: Intraparenchymal hemorrhage   :                                                                                                                                         [name] is an 60 y.o. 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 He was started at discharge from the hospital 3 days after the stroke onset, on Eliquis 5 mg twice a day. He has been compliant with the medication, took his last dose this morning at about 8 AM. He is also on aspirin 325 mg daily for the past few years and continues to take aspirin now, along with Eliquis. 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 This morning he woke up with right-sided headache, nausea and generalized fatigue but no other focal neurological symptoms. He was advised to go to the ER by his wife for further evaluation. Initial CT of the head showed a large right parieto-occipital parenchymal hemorrhage. Neurosurgery evaluated the patient in the ER and at this time no surgical intervention is planned. His blood pressure was elevated to 170s systolic has been started on nicardipine drip to maintain goal systolic blood pressure less than 130 to reduce the risk of hemorrhagic expansion. He was also started on Kcentra, after consultation with pharmacy.      Past Medical History  Diagnosis Date  . Hypertension   . Dysrhythmia     afib  . Sleep apnea     cpap for 2 yrs  . GERD (gastroesophageal reflux disease)   . Arthritis   . Complication of anesthesia     hx twice of tachy after surgery  . Refusal of blood transfusions as patient is Jehovah's Witness   . PONV (postoperative nausea and vomiting)   . 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 Stroke [facility])     Past Surgical History  Procedure Laterality Date  . Lithotriopsy  15    ? stone -could not  find  . Joint replacement Right 10    knee  . Back surgery      x4 2 diskectomies -2 fusions last 08  . Knee arthroplasty Left 12/24/2014    Procedure: COMPUTER ASSISTED TOTAL KNEE ARTHROPLASTY;  Surgeon: Eldred Manges, MD;  Location: [facility] OR;  Service: Orthopedics;  Laterality: Left;  . Total knee arthroplasty Left 12/24/2014    No family history on file. Social History:  reports that he has never smoked. He has never used smokeless tobacco. He reports that he drinks alcohol. He reports that he does not use illicit drugs.  Allergies:  Allergies  Allergen Reactions  . Penicillins Swelling    Tongue swells    Medications:  Current facility-administered medications:  .  insulin aspart (novoLOG) injection 0-24 Units, 0-24 Units, Subcutaneous, 6 times per day, Wanell Lorenzi Daniel NonesNarayan Kaveer Lakeith Careaga, MD .  nicardipine (CARDENE) 20mg  in 0.86% saline 200ml IV infusion (0.1 mg/ml), 3-15 mg/hr, Intravenous, Continuous, Aiyanah Kalama Daniel NonesNarayan Kaveer Joshua Soulier, MD, Last Rate: 75 mL/hr at 05/27/15 1757, 7.5 mg/hr at 05/27/15 1757   ROS:                                                                                                                                       History obtained from the patient  General ROS: negative for - chills, fatigue, fever, night sweats, weight gain or weight loss Psychological ROS: negative for - behavioral disorder, hallucinations, memory difficulties, mood swings or suicidal ideation Ophthalmic ROS:  positive for loss of vision in the left visual field, since the right PCA stroke in December 2016 ENT ROS: negative for - epistaxis, nasal discharge, oral lesions, sore throat, tinnitus or vertigo Allergy and Immunology ROS: negative for - hives or itchy/watery eyes Hematological and Lymphatic ROS: negative for - bleeding problems, bruising or swollen lymph  nodes Endocrine ROS: negative for - galactorrhea, hair pattern changes, polydipsia/polyuria or temperature intolerance Respiratory ROS: negative for - cough, hemoptysis, shortness of breath or wheezing Cardiovascular ROS: negative for - chest pain, dyspnea on exertion, edema or irregular heartbeat Gastrointestinal ROS: negative for - abdominal pain, diarrhea, hematemesis, nausea/vomiting or stool incontinence Genito-Urinary ROS: negative for - dysuria, hematuria, incontinence or urinary frequency/urgency Musculoskeletal ROS: negative for - joint swelling or muscular weakness Neurological ROS: as noted in HPI Dermatological ROS: negative for rash and skin lesion changes  Neurologic Examination:                                                                                                      Blood pressure 126/95, pulse 45, temperature 98.2 F (36.8 C), temperature source Oral, resp. rate 17, height 6\' 3"  (1.905 m), weight 88.9 kg (195 lb 15.8 oz), SpO2 92 %.   Neurological Examination Mental Status: Alert, oriented, thought content appropriate.  Speech fluent without evidence of aphasia.  Able to follow 3 step commands without difficulty. Cranial Nerves: II: ; Visual fields  examination show a dense left homonymous hemianopia , pupils equal, round, reactive to light and accommodation III,IV, VI: ptosis not present, extra-ocular motions intact bilaterally V,VII: smile symmetric, facial light touch sensation normal bilaterally VIII: hearing normal bilaterally IX,X: uvula rises symmetrically XI: bilateral shoulder shrug XII: midline tongue extension  Motor: Right : Upper extremity   5/5    Left:     Upper extremity   5/5  Lower extremity   5/5     Lower extremity   5/5 Tone and bulk:normal tone throughout; no atrophy noted Sensory: Pinprick and light touch intact throughout, bilaterally Deep Tendon Reflexes: 2+ and symmetric throughout Plantars: Right: downgoing   Left:  downgoing Cerebellar: normal finger-to-nose, normal rapid alternating movements and normal heel-to-shin test Gait:  deferred   Lab Results: Basic Metabolic Panel:  Recent Labs Lab 05/27/15 1630 05/27/15 1643  NA 139 140  K 4.1 4.0  CL 101 100*  CO2 25  --   GLUCOSE 183* 178*  BUN 9 11  CREATININE 0.85 0.70  CALCIUM 10.0  --     Liver Function Tests:  Recent Labs Lab 05/27/15 1630  AST 21  ALT 16*  ALKPHOS 69  BILITOT 1.0  PROT 7.1  ALBUMIN 3.8   No results for input(s): LIPASE, AMYLASE in the last 168 hours. No results for input(s): AMMONIA in the last 168 hours.  CBC:  Recent Labs Lab 05/27/15 1630 05/27/15 1643  WBC 8.5  --   NEUTROABS 5.9  --   HGB 13.3 16.7  HCT 42.0 49.0  MCV 77.2*  --   PLT 372  --     Cardiac Enzymes: No results for input(s): CKTOTAL, CKMB, CKMBINDEX, TROPONINI in the last 168 hours.  Lipid Panel: No results for input(s): CHOL, TRIG, HDL, CHOLHDL, VLDL, LDLCALC in the last 168 hours.  CBG: No results for input(s): GLUCAP in the last 168 hours.  Microbiology: Results for orders placed or performed during the hospital encounter of 12/14/14  Surgical pcr screen     Status: None   Collection Time: 12/14/14 10:45 AM  Result Value Ref Range Status   MRSA, PCR NEGATIVE NEGATIVE Final   Staphylococcus aureus NEGATIVE NEGATIVE Final    Comment:        The Xpert SA Assay (FDA approved for NASAL specimens in patients over 60 years of age), is one component of a comprehensive surveillance program.  Test performance has been validated by [facility] for patients greater than or equal to 39 year old. It is not intended to diagnose infection nor to guide or monitor treatment.     Coagulation Studies:  Recent Labs  05/27/15 1630  LABPROT 16.7*  INR 1.34    Imaging: Ct Head Wo Contrast  05/27/2015  CLINICAL DATA:  Stroke last week with headache. History of atrial fibrillation. EXAM: CT HEAD WITHOUT CONTRAST TECHNIQUE:  Contiguous axial images were obtained from the base of the skull through the vertex without intravenous contrast. COMPARISON:  None. FINDINGS: Sinuses/Soft tissues: Clear paranasal sinuses and mastoid air cells. Intracranial: Mildly age advanced cerebral atrophy. Hemorrhage centered in the right PCA distribution, likely at the site of a subacute infarct. Hemorrhage measures 7.3 x 5.0 cm on image 17/series 2. Extension into the right lateral ventricle. Mass effect, with approximately 7 mm of right-to-left midline shift. Basal cisterns remain patent. There is prominence of the right temporal horn, suggesting developing hydrocephalus. No new infarct identified. IMPRESSION: Right parietal/occipital lobe hemorrhage, likely due to hemorrhagic transformation of a right PCA distribution infarct. Mass effect with extension of hemorrhage into the right lateral ventricle and probable developing hydrocephalus. Critical test results telephoned to . Dr. Hyacinth Meeker. at the time of interpretation at 4:58 p.m.on 05/27/2015. Electronically Signed   By: Jeronimo Greaves M.D.   On: 05/27/2015 17:01  Stroke Risk Factors - atrial fibrillation, diabetes mellitus and hypertension   Assessment: 60 y.o. male  patient with the atrial fibrillation, recent right PCA infarct in December 2016 , was started on Eliquis a discharge from the [facility] after his PCA infarct , and he continued to take aspirin 325 mg in addition to eliquis. He presented to the ER with a large right parieto-occipital intraparenchymal hemorrhage which appears to have bled into the right PCA infarct based on its location. Clinically, except for the left homonymous hemianopia which patient reported his been present since the onset of the right PCA stroke on 05/14/2015, no other focal neurological deficits are noted. Despite the large size of the hemorrhage , he is doing well clinically at this time. Neurosurgery was consulted and was not deemed to be a candidate for any  intervention at this time.  He is on nicardipine drip to control his blood pressure with a goal systolic blood pressures between 110 to 130, to reduce the risk of hemorrhagic expansion. Theodoro Parma was started in the ER.  Recommend CT angiogram of the head and neck. Both aspirin and  eliquis are discontinued now, placed on insulin sliding scale for his diabetes.  He will be admitted to the ICU for close neurological monitoring. Reviewed the CT images with the patient and his wife, extending the findings, discussed the pathophysiology, care plan and prognosis in detail, answered several of their questions to their satisfaction.  ICH score: 2  Stroke service will continue to follow-up with the patient tomorrow.

## 2015-05-27 NOTE — ED Provider Notes (Signed)
The patient is a 60 year old male, reports that he had a recent ischemic stroke, this [facility], he was started on blood thinners because of chronic A. fib which they thought was because of an embolic stroke. He states that he has had a headache that started the same day that he developed a stroke on December 20, this headache comes and goes, it is right-sided, behind his eye all the way to the back of his head on the right. He denies numbness, weakness, slurred speech or difficulty with coordination but has some difficulty ambulating because of his left visual field deficit.  On exam the patient does have a left visual field deficit in bilateral eyes otherwise his visual acuity is at baseline, extraocular movements are intact.  Rule out hemorrhagic transformation of stroke, pain medications ordered no new neuro defecits seen.  Discussed with neurologist Dr. Lavon PaganiniNandigam, discussed with neurosurgeon Dr. Danielle DessElsner, discussed with pharmacist, qand d/w Dr. Reche Dixonalbot of radiology  [facility], admit to neuro service, ICU.  Cardene Drip  CRITICAL CARE Performed by: Vida RollerMILLER,Chiana Wamser D Total critical care time: 35 minutes Critical care time was exclusive of separately billable procedures and treating other patients. Critical care was necessary to treat or prevent imminent or life-threatening deterioration. Critical care was time spent personally by me on the following activities: development of treatment plan with patient and/or surrogate as well as nursing, discussions with consultants, evaluation of patient's response to treatment, examination of patient, obtaining history from patient or surrogate, ordering and performing treatments and interventions, ordering and review of laboratory studies, ordering and review of radiographic studies, pulse oximetry and re-evaluation of patient's condition.    EKG Interpretation  Date/Time:  Monday May 27 2015 15:51:41  EST Ventricular Rate:  100 PR Interval:    QRS Duration: 74 QT Interval:  343 QTC Calculation: 442 R Axis:   41 Text Interpretation:  Atrial fibrillation Borderline ST elevation, anterior leads since last tracing no significant change other than rate faster Abnormal ekg Confirmed by Hyacinth MeekerMILLER  MD, Signe Tackitt ([id number]) on 05/27/2015 4:09:55 PM       Medical screening examination/treatment/procedure(s) were conducted as a shared visit with non-physician practitioner(s) and myself.  I personally evaluated the patient during the encounter.  Clinical Impression:   Final diagnoses:  Hemorrhagic stroke [facility](HCC)         Eber HongBrian Naysha Sholl, MD 05/28/15 1404

## 2015-05-27 NOTE — Progress Notes (Addendum)
Notified Dr. Cyril Mourningamillo regarding patients increased heart rate. Pt HR ranging between 120-140s and increases to about 170s with activity - non sustained. BP 133/97. Pt denies chest pain, SOB or any other discomfort. Orders received to monitor patient's HR if still sustained greater than 120 in an hour.

## 2015-05-27 NOTE — ED Notes (Signed)
CT brought pt back and stated that IV infiltrated and they were unable to complete the exam.  CT staff paged the neurologist to notify him.

## 2015-05-27 NOTE — ED Notes (Signed)
Pt here via [facility] for nausea, dizziness when moving into standing position.  Symptoms started last night. Stroke 1.5 week ago and was [facility].  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  EKG per EMS was afib per hx.  CBG 208, bp 170/100.  HR 108.

## 2015-05-28 ENCOUNTER — Inpatient Hospital Stay (HOSPITAL_COMMUNITY): Payer: Non-veteran care

## 2015-05-28 DIAGNOSIS — I10 Essential (primary) hypertension: Secondary | ICD-10-CM

## 2015-05-28 DIAGNOSIS — I619 Nontraumatic intracerebral hemorrhage, unspecified: Secondary | ICD-10-CM | POA: Diagnosis not present

## 2015-05-28 DIAGNOSIS — E1159 Type 2 diabetes mellitus with other circulatory complications: Secondary | ICD-10-CM

## 2015-05-28 DIAGNOSIS — I6789 Other cerebrovascular disease: Secondary | ICD-10-CM

## 2015-05-28 DIAGNOSIS — G4733 Obstructive sleep apnea (adult) (pediatric): Secondary | ICD-10-CM

## 2015-05-28 DIAGNOSIS — I611 Nontraumatic intracerebral hemorrhage in hemisphere, cortical: Secondary | ICD-10-CM | POA: Diagnosis not present

## 2015-05-28 DIAGNOSIS — I481 Persistent atrial fibrillation: Secondary | ICD-10-CM

## 2015-05-28 DIAGNOSIS — G936 Cerebral edema: Secondary | ICD-10-CM

## 2015-05-28 LAB — BASIC METABOLIC PANEL
ANION GAP: 11 (ref 5–15)
BUN: 6 mg/dL (ref 6–20)
CO2: 26 mmol/L (ref 22–32)
Calcium: 10 mg/dL (ref 8.9–10.3)
Chloride: 101 mmol/L (ref 101–111)
Creatinine, Ser: 0.92 mg/dL (ref 0.61–1.24)
GLUCOSE: 127 mg/dL — AB (ref 65–99)
POTASSIUM: 3.8 mmol/L (ref 3.5–5.1)
Sodium: 138 mmol/L (ref 135–145)

## 2015-05-28 LAB — GLUCOSE, CAPILLARY
Glucose-Capillary: 133 mg/dL — ABNORMAL HIGH (ref 65–99)
Glucose-Capillary: 156 mg/dL — ABNORMAL HIGH (ref 65–99)
Glucose-Capillary: 157 mg/dL — ABNORMAL HIGH (ref 65–99)
Glucose-Capillary: 159 mg/dL — ABNORMAL HIGH (ref 65–99)
Glucose-Capillary: 167 mg/dL — ABNORMAL HIGH (ref 65–99)
Glucose-Capillary: 170 mg/dL — ABNORMAL HIGH (ref 65–99)

## 2015-05-28 LAB — CBC
HCT: 45 % (ref 39.0–52.0)
Hemoglobin: 14.9 g/dL (ref 13.0–17.0)
MCH: 25.4 pg — ABNORMAL LOW (ref 26.0–34.0)
MCHC: 33.1 g/dL (ref 30.0–36.0)
MCV: 76.7 fL — ABNORMAL LOW (ref 78.0–100.0)
Platelets: 388 K/uL (ref 150–400)
RBC: 5.87 MIL/uL — ABNORMAL HIGH (ref 4.22–5.81)
RDW: 14.2 % (ref 11.5–15.5)
WBC: 9.9 K/uL (ref 4.0–10.5)

## 2015-05-28 LAB — SODIUM
SODIUM: 140 mmol/L (ref 135–145)
Sodium: 138 mmol/L (ref 135–145)

## 2015-05-28 LAB — NO BLOOD PRODUCTS

## 2015-05-28 LAB — MRSA PCR SCREENING: MRSA BY PCR: NEGATIVE

## 2015-05-28 MED ORDER — BUTALBITAL-APAP-CAFFEINE 50-325-40 MG PO TABS
1.0000 | ORAL_TABLET | Freq: Three times a day (TID) | ORAL | Status: DC | PRN
  Administered 2015-05-28 – 2015-06-09 (×8): 1 via ORAL
  Filled 2015-05-28 (×8): qty 1

## 2015-05-28 MED ORDER — TRAMADOL HCL 50 MG PO TABS
50.0000 mg | ORAL_TABLET | Freq: Four times a day (QID) | ORAL | Status: DC
  Administered 2015-05-28 (×2): 50 mg via ORAL
  Filled 2015-05-28 (×2): qty 1

## 2015-05-28 MED ORDER — SODIUM CHLORIDE 3 % IV SOLN
INTRAVENOUS | Status: DC
  Administered 2015-05-28: 50 mL/h via INTRAVENOUS
  Administered 2015-05-29: 60 mL/h via INTRAVENOUS
  Administered 2015-05-29 – 2015-05-30 (×3): 75 mL/h via INTRAVENOUS
  Administered 2015-05-31 (×2): 100 mL/h via INTRAVENOUS
  Filled 2015-05-28 (×22): qty 500

## 2015-05-28 MED ORDER — METOPROLOL TARTRATE 50 MG PO TABS
75.0000 mg | ORAL_TABLET | Freq: Two times a day (BID) | ORAL | Status: DC
  Administered 2015-05-28 – 2015-06-11 (×29): 75 mg via ORAL
  Filled 2015-05-28 (×29): qty 1

## 2015-05-28 MED ORDER — BOOST / RESOURCE BREEZE PO LIQD
1.0000 | Freq: Three times a day (TID) | ORAL | Status: DC
  Administered 2015-05-28 – 2015-05-30 (×5): 1 via ORAL

## 2015-05-28 MED ORDER — PANTOPRAZOLE SODIUM 40 MG PO TBEC
40.0000 mg | DELAYED_RELEASE_TABLET | Freq: Every day | ORAL | Status: DC
  Administered 2015-05-28 – 2015-05-31 (×4): 40 mg via ORAL
  Filled 2015-05-28 (×4): qty 1

## 2015-05-28 MED ORDER — INSULIN ASPART 100 UNIT/ML ~~LOC~~ SOLN
0.0000 [IU] | Freq: Three times a day (TID) | SUBCUTANEOUS | Status: DC
  Administered 2015-05-28: 4 [IU] via SUBCUTANEOUS
  Administered 2015-05-29: 8 [IU] via SUBCUTANEOUS
  Administered 2015-05-29 – 2015-05-30 (×2): 12 [IU] via SUBCUTANEOUS
  Administered 2015-05-30 – 2015-06-01 (×3): 4 [IU] via SUBCUTANEOUS
  Administered 2015-06-01: 1 [IU] via SUBCUTANEOUS
  Administered 2015-06-02: 2 [IU] via SUBCUTANEOUS
  Administered 2015-06-03: 4 [IU] via SUBCUTANEOUS
  Administered 2015-06-03: 2 [IU] via SUBCUTANEOUS
  Administered 2015-06-06: 4 [IU] via SUBCUTANEOUS
  Administered 2015-06-07 – 2015-06-09 (×6): 2 [IU] via SUBCUTANEOUS
  Administered 2015-06-09: 4 [IU] via SUBCUTANEOUS
  Administered 2015-06-10: 2 [IU] via SUBCUTANEOUS

## 2015-05-28 NOTE — Progress Notes (Signed)
Patient ID: [name], male   DOB: 19-Jan-1956, 60 y.o.   MRN: [id number] Vital signs are stable Patient is awake alert no evidence of significant weakness on the left side Right field cut remains unchanged CT scan of head shows no change in size of intracerebral hemorrhage Continue to follow clinically

## 2015-05-28 NOTE — Progress Notes (Signed)
STROKE TEAM PROGRESS NOTE   HISTORY ONOFRIO KLEMP is an 60 y.o. 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 He was started at discharge from the hospital 3 days after the stroke onset, on Eliquis 5 mg twice a day. He has been compliant with the medication, took his last dose this morning at about 8 AM. He is also on aspirin 325 mg daily for the past few years and continues to take aspirin now, along with Eliquis. 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 This morning he woke up with right-sided headache, nausea and generalized fatigue but no other focal neurological symptoms. He was advised to go to the ER by his wife for further evaluation. Initial CT of the head showed a large right parieto-occipital parenchymal hemorrhage. Neurosurgery evaluated the patient in the ER and at this time no surgical intervention is planned. His blood pressure was elevated to 170s systolic has been started on nicardipine drip to maintain goal systolic blood pressure less than 130 to reduce the risk of hemorrhagic expansion. He was also started on Kcentra, after consultation with pharmacy.    SUBJECTIVE (INTERVAL HISTORY) His wife is at the bedside.  Overall he feels his condition is stable. He still complains of HA and nausea. He prefers liquid diet at this time. He still has hemianopia on the left hemianopia due to previous stroke. He continued at home with eliquis and ASA. However, his wife stated that doctor in [place] recommended no ASA only eliquis. Pt seems surprise to know today.    OBJECTIVE Temp:  [98.1 F (36.7 C)-98.6 F (37 C)] 98.1 F (36.7 C) (01/03 0428) Pulse Rate:  [43-260] 96 (01/03 0600) Cardiac Rhythm:  [-] Atrial fibrillation (01/02 2000) Resp:  [11-27] 16 (01/03 0600) BP: (96-156)/(67-106) 119/79 mmHg (01/03 0600) SpO2:  [89 %-100 %] 100 % (01/03  0600) Weight:  [88.9 kg (195 lb 15.8 oz)-97.523 kg (215 lb)] 88.9 kg (195 lb 15.8 oz) (01/02 2100)  CBC:  Recent Labs Lab 05/27/15 1630 05/27/15 1643  WBC 8.5  --   NEUTROABS 5.9  --   HGB 13.3 16.7  HCT 42.0 49.0  MCV 77.2*  --   PLT 372  --     Basic Metabolic Panel:  Recent Labs Lab 05/27/15 1630 05/27/15 1643  NA 139 140  K 4.1 4.0  CL 101 100*  CO2 25  --   GLUCOSE 183* 178*  BUN 9 11  CREATININE 0.85 0.70  CALCIUM 10.0  --     Lipid Panel: No results found for: CHOL, TRIG, HDL, CHOLHDL, VLDL, LDLCALC HgbA1c:  Lab Results  Component Value Date   HGBA1C 6.9* 12/14/2014   Urine Drug Screen:    Component Value Date/Time   LABOPIA POSITIVE* 05/27/2015 1944   COCAINSCRNUR NONE DETECTED 05/27/2015 1944   LABBENZ NONE DETECTED 05/27/2015 1944   AMPHETMU NONE DETECTED 05/27/2015 1944   THCU NONE DETECTED 05/27/2015 1944   LABBARB NONE DETECTED 05/27/2015 1944      IMAGING I have personally reviewed the radiological images below and agree with the radiology interpretations.  Ct Angio Head and Neck W/cm &/or Wo Cm  05/27/2015   Aborted study due to extravasation of IV contrast into the arm. Study will perhaps be attempted at a later time.   Ct Head Wo Contrast 05/27/2015   Right parietal/occipital lobe hemorrhage, likely due  to hemorrhagic transformation of a right PCA distribution infarct. Mass effect with extension of hemorrhage into the right lateral ventricle and probable developing hydrocephal  05/28/2015 IMPRESSION: 1. Size stable parenchymal hemorrhage in the posterior right cerebral hemisphere, again suggestive of hemorrhagic PCA territory infarct. Mass effect with 8mm midline shift is stable. 2. Stable intraventricular extension with right temporal horn obstruction. 3. Correlation with reported outside imaging would be useful. Otherwise, surveillance is recommended.   TTE - - Left ventricle: The cavity size was normal. Systolic function was normal. The  estimated ejection fraction was in the range of 60% to 65%. Wall motion was normal; there were no regional wall motion abnormalities. The study was not technically sufficient to allow evaluation of LV diastolic dysfunction due to atrial fibrillation. - Aortic valve: There was no regurgitation. - Aortic root: The aortic root was normal in size. - Mitral valve: Structurally normal valve. There was mild regurgitation. - Left atrium: The atrium was mildly dilated. - Right ventricle: The cavity size was normal. Wall thickness was normal. Systolic function was normal. - Right atrium: The atrium was normal in size. - Tricuspid valve: There was mild regurgitation. - Pulmonic valve: There was no regurgitation. - Pulmonary arteries: Systolic pressure was within the normal range. - Inferior vena cava: The vessel was normal in size. - Pericardium, extracardiac: There was no pericardial effusion.   PHYSICAL EXAM  Temp:  [97.5 F (36.4 C)-98.6 F (37 C)] 97.5 F (36.4 C) (01/03 0800) Pulse Rate:  [43-260] 106 (01/03 1100) Resp:  [11-27] 11 (01/03 1100) BP: (96-156)/(67-106) 139/90 mmHg (01/03 1100) SpO2:  [89 %-100 %] 98 % (01/03 1100) Weight:  [195 lb 15.8 oz (88.9 kg)-215 lb (97.523 kg)] 195 lb 15.8 oz (88.9 kg) (01/02 2100)  General - Well nourished, well developed, in mild distress due to HA and nausea.  Ophthalmologic - Fundi not visualized due to distress.  Cardiovascular - Regular rate and rhythm.  Mental Status -  Level of arousal and orientation to time, place, and person were intact. Language including expression, naming, repetition, comprehension was assessed and found intact. Attention span and concentration were normal. Fund of Knowledge was assessed and was intact.  Cranial Nerves II - XII - II - left homonymous hemianopia. III, IV, VI - Extraocular movements intact. V - Facial sensation intact bilaterally. VII - Facial movement intact bilaterally. VIII  - Hearing & vestibular intact bilaterally. X - Palate elevates symmetrically. XI - Chin turning & shoulder shrug intact bilaterally. XII - Tongue protrusion intact.  Motor Strength - The patient's strength was normal in all extremities and pronator drift was absent.  Bulk was normal and fasciculations were absent.   Motor Tone - Muscle tone was assessed at the neck and appendages and was normal.  Reflexes - The patient's reflexes were 1+ in all extremities and he had no pathological reflexes.  Sensory - Light touch, temperature/pinprick were assessed and were symmetrical.    Coordination - The patient had normal movements in the hands and feet with no ataxia or dysmetria.  Tremor was absent.  Gait and Station - not tested.   ASSESSMENT/PLAN Mr. KALYAN BARABAS is a 60 y.o. 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  He did not receive IV t-PA due to hemorrhagic stroke.  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  Ongoing aggressive stroke risk factor management  Therapy recommendations: Pending  Disposition: Pending  Cerebral edema  Midline shift stable on CT head  Will place central line  3% saline    Na goal 150-160  Na Q6h  Recent right PCA infarct  With left hemianopia  Likely due to afib on on AC  Afib on eliquis PTA  Rate 90-115  Put back on home metoprolol  Hold off eliquis due to ICH  Discontinue ASA as home medication  Hypertension  Stable  Off cardene  Put back on home metoprolol  Hyperlipidemia  Home meds:  No lipid lowering medications prior to admission  LDL pending, goal < 70  Continue statin at discharge  Diabetes  HgbA1c pending, goal < 7.0  SSI  Home meds with metformin and glipizide  CBG monitoring  Other Stroke Risk Factors  Advanced age  ETOH use  Hx stroke/TIA  Obstructive sleep apnea, on CPAP at home  Other Active Problems  HA - fioricet PRN  Nausea - zofran PRN  Hospital day # 1  This patient is critically ill due to large right occipitoparietal ICH, recent stroke, afib RVR and at significant risk of neurological worsening, death form cerebral edema, brain herniation, recurrent stroke and heart failure. This patient's care requires constant monitoring of vital signs, hemodynamics, respiratory and cardiac monitoring, review of multiple databases, neurological assessment, discussion with family, other specialists and medical decision making of high complexity. I spent 45 minutes of neurocritical care time in the care of this patient.  Marvel PlanJindong Dorman Calderwood, MD PhD Stroke Neurology 05/28/2015 1:57 PM       To contact Stroke Continuity provider, please refer to [email or web address]. After hours, contact General Neurology

## 2015-05-28 NOTE — Procedures (Signed)
Central Venous Catheter Insertion Procedure Note Geroge BasemanOtis C Abundis [id number] 11/09/1955  Procedure: Insertion of Central Venous Catheter Indications: Drug and/or fluid administration and Hypertonic saline administration  Procedure Details Consent: Risks of procedure as well as the alternatives and risks of each were explained to the (patient/caregiver).  Consent for procedure obtained. Time Out: Verified patient identification, verified procedure, site/side was marked, verified correct patient position, special equipment/implants available, medications/allergies/relevent history reviewed, required imaging and test results available.  Performed  Maximum sterile technique was used including antiseptics, cap, gloves, gown, hand hygiene, mask and sheet. Skin prep: Chlorhexidine; local anesthetic administered A antimicrobial bonded/coated triple lumen catheter was placed in the left internal jugular vein under ultrasound guidance using the Seldinger technique.  Evaluation Blood flow good Complications: No apparent complications Patient did tolerate procedure well. Chest X-ray ordered to verify placement.  CXR: pending.  Magddalene S Tukov 05/28/2015, 4:12 PM

## 2015-05-28 NOTE — Care Management Note (Signed)
Case Management Note  Patient Details  Name: [name] MRN: [id number] Date of Birth: 08-28-1955  Subjective/Objective:   Pt admitted on 05/27/15 with hemorrhagic stroke.  PTA, pt independent, lives with spouse.                   Action/Plan: Will follow for discharge planning as pt progresses.    Expected Discharge Date:                  Expected Discharge Plan:   IP Rehab  In-House Referral:   CM referral  Discharge planning Services     Post Acute Care Choice:    Choice offered to:     DME Arranged:    DME Agency:     HH Arranged:    HH Agency:     Status of Service:   In process, will continue to follow  Medicare Important Message Given:    Date Medicare IM Given:    Medicare IM give by:    Date Additional Medicare IM Given:    Additional Medicare Important Message give by:     If discussed at Long Length of Stay Meetings, dates discussed:    Additional Comments:  Quintella BatonJulie W. Tenasia Aull, RN, BSN  Trauma/Neuro ICU Case Manager [phone]

## 2015-05-28 NOTE — Progress Notes (Signed)
  Echocardiogram 2D Echocardiogram has been performed.  Trevor SavoyCasey N Maryland Luppino 05/28/2015, 11:40 AM

## 2015-05-28 NOTE — Progress Notes (Signed)
Initial Nutrition Assessment  DOCUMENTATION CODES:   Non-severe (moderate) malnutrition in context of chronic illness  INTERVENTION:   Boost Breeze po TID, each supplement provides 250 kcal and 9 grams of protein  NUTRITION DIAGNOSIS:   Malnutrition related to chronic illness as evidenced by mild depletion of body fat, mild depletion of muscle mass, 19 percent weight loss in 5 months.  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 Admitted 1/2 with large right ICH.   Medications reviewed and include: 3% Labs reviewed Nutrition-Focused physical exam completed. Findings are mild/moderate fat depletion, mild/moderate muscle depletion, and no edema.  Per pt and wife pt had knee surgery in 8/16 and had significant pain resulting in loss of appetite and poor intake. He lost from his usual weight of 240 lb to 210 lb. He then started to eat better and was eating a healthier diet until 2 weeks ago and he again was only eating one meal a day with poor appetite. Weight is now down to 195 lb (total weight loss 19% x 5 months). Wife very concerned. Pt only had applesauce this am due to ongoing nausea, has zofran ordered.   Diet Order:  Diet full liquid Room service appropriate?: Yes; Fluid consistency:: Thin  Skin:  Reviewed, no issues  Last BM:  unknown  Height:   Ht Readings from Last 1 Encounters:  05/27/15 6\' 3"  (1.905 m)   Weight:   Wt Readings from Last 1 Encounters:  05/27/15 195 lb 15.8 oz (88.9 kg)   Ideal Body Weight:  89 kg  BMI:  Body mass index is 24.5 kg/(m^2).  Estimated Nutritional Needs:   Kcal:  2200-2400  Protein:  115-130 grams  Fluid:  > 2.2  L/day  EDUCATION NEEDS:   No education needs identified at this time  Kendell BaneHeather Stephana Morell RD, LDN, CNSC [phone] Pager [phone] After Hours Pager

## 2015-05-29 ENCOUNTER — Inpatient Hospital Stay (HOSPITAL_COMMUNITY): Payer: Non-veteran care

## 2015-05-29 DIAGNOSIS — Z8673 Personal history of transient ischemic attack (TIA), and cerebral infarction without residual deficits: Secondary | ICD-10-CM

## 2015-05-29 LAB — BASIC METABOLIC PANEL
ANION GAP: 10 (ref 5–15)
BUN: 8 mg/dL (ref 6–20)
CHLORIDE: 107 mmol/L (ref 101–111)
CO2: 26 mmol/L (ref 22–32)
CREATININE: 1.01 mg/dL (ref 0.61–1.24)
Calcium: 9.5 mg/dL (ref 8.9–10.3)
GFR calc non Af Amer: >60 mL/min (ref >60)
Glucose, Bld: 158 mg/dL — ABNORMAL HIGH (ref 65–99)
POTASSIUM: 4.1 mmol/L (ref 3.5–5.1)
SODIUM: 143 mmol/L (ref 135–145)

## 2015-05-29 LAB — LIPID PANEL
CHOL/HDL RATIO: 3.2 ratio
CHOLESTEROL: 111 mg/dL (ref 0–200)
HDL: 35 mg/dL — AB (ref >40)
LDL CALC: 62 mg/dL (ref 0–99)
TRIGLYCERIDES: 72 mg/dL (ref <150)
VLDL: 14 mg/dL (ref 0–40)

## 2015-05-29 LAB — CBC
HEMATOCRIT: 43 % (ref 39.0–52.0)
HEMOGLOBIN: 13.8 g/dL (ref 13.0–17.0)
MCH: 24.7 pg — ABNORMAL LOW (ref 26.0–34.0)
MCHC: 32.1 g/dL (ref 30.0–36.0)
MCV: 76.9 fL — ABNORMAL LOW (ref 78.0–100.0)
Platelets: 318 10*3/uL (ref 150–400)
RBC: 5.59 MIL/uL (ref 4.22–5.81)
RDW: 14.2 % (ref 11.5–15.5)
WBC: 8.3 10*3/uL (ref 4.0–10.5)

## 2015-05-29 LAB — GLUCOSE, CAPILLARY
GLUCOSE-CAPILLARY: 71 mg/dL (ref 65–99)
Glucose-Capillary: 139 mg/dL — ABNORMAL HIGH (ref 65–99)
Glucose-Capillary: 260 mg/dL — ABNORMAL HIGH (ref 65–99)
Glucose-Capillary: 220 mg/dL — ABNORMAL HIGH (ref 65–99)

## 2015-05-29 LAB — SODIUM
SODIUM: 143 mmol/L (ref 135–145)
Sodium: 147 mmol/L — ABNORMAL HIGH (ref 135–145)
Sodium: 146 mmol/L — ABNORMAL HIGH (ref 135–145)

## 2015-05-29 MED ORDER — DILTIAZEM HCL ER 240 MG PO CP24
240.0000 mg | ORAL_CAPSULE | Freq: Every day | ORAL | Status: DC
  Administered 2015-05-29 – 2015-05-30 (×2): 240 mg via ORAL
  Filled 2015-05-29 (×2): qty 1

## 2015-05-29 MED ORDER — LISINOPRIL 20 MG PO TABS
20.0000 mg | ORAL_TABLET | Freq: Every day | ORAL | Status: DC
  Administered 2015-05-29 – 2015-06-01 (×4): 20 mg via ORAL
  Filled 2015-05-29 (×4): qty 1

## 2015-05-29 MED ORDER — GABAPENTIN 600 MG PO TABS
600.0000 mg | ORAL_TABLET | Freq: Four times a day (QID) | ORAL | Status: DC
  Administered 2015-05-29 – 2015-05-30 (×6): 600 mg via ORAL
  Filled 2015-05-29 (×11): qty 1

## 2015-05-29 NOTE — Progress Notes (Signed)
Patient ID: [name], male   DOB: 10/18/1955, 60 y.o.   MRN: [id number] Vital signs are stable Patient's spirits are good Motor function remained stable Visual field cut is stable also I will sign off at this time is I am doubtful patient will require surgical intervention These reconsult if surgical evaluation is felt to be needed further

## 2015-05-29 NOTE — Progress Notes (Signed)
STROKE TEAM PROGRESS NOTE   SUBJECTIVE (INTERVAL HISTORY) [name] is at the bedside.  Overall he feels Trevor condition is stable. On 3% saline Na 143. BP stable off cardene, on metoprolol. HR 110, will resume cardizem. Repeat CT in am.   OBJECTIVE Temp:  [97.8 F (36.6 C)-98.3 F (36.8 C)] 98 F (36.7 C) (01/04 1144) Pulse Rate:  [78-109] 86 (01/04 0500) Cardiac Rhythm:  [-] Atrial fibrillation (01/04 0400) Resp:  [12-26] 13 (01/04 0700) BP: (129-152)/(84-114) 146/92 mmHg (01/04 0700) SpO2:  [95 %-100 %] 98 % (01/04 0700)  CBC:  Recent Labs Lab 05/27/15 1630  05/28/15 1045 05/29/15 0510  WBC 8.5  --  9.9 8.3  NEUTROABS 5.9  --   --   --   HGB 13.3  < > 14.9 13.8  HCT 42.0  < > 45.0 43.0  MCV 77.2*  --  76.7* 76.9*  PLT 372  --  388 318  < > = values in this interval not displayed.  Basic Metabolic Panel:  Recent Labs Lab 05/28/15 1045  05/29/15 0510 05/29/15 1155  NA 138  < > 143 143  K 3.8  --  4.1  --   CL 101  --  107  --   CO2 26  --  26  --   GLUCOSE 127*  --  158*  --   BUN 6  --  8  --   CREATININE 0.92  --  1.01  --   CALCIUM 10.0  --  9.5  --   < > = values in this interval not displayed.  Lipid Panel:     Component Value Date/Time   CHOL 111 05/29/2015 0510   TRIG 72 05/29/2015 0510   HDL 35* 05/29/2015 0510   CHOLHDL 3.2 05/29/2015 0510   VLDL 14 05/29/2015 0510   LDLCALC 62 05/29/2015 0510   HgbA1c:  Lab Results  Component Value Date   HGBA1C 6.9* 12/14/2014   Urine Drug Screen:     Component Value Date/Time   LABOPIA POSITIVE* 05/27/2015 1944   COCAINSCRNUR NONE DETECTED 05/27/2015 1944   LABBENZ NONE DETECTED 05/27/2015 1944   AMPHETMU NONE DETECTED 05/27/2015 1944   THCU NONE DETECTED 05/27/2015 1944   LABBARB NONE DETECTED 05/27/2015 1944      IMAGING I have personally reviewed the radiological images below and agree with the radiology interpretations.  Ct Angio Head and Neck W/cm &/or Wo Cm  05/27/2015   Aborted study due to  extravasation of IV contrast into the arm. Study will perhaps be attempted at a later time.   Ct Head Wo Contrast 05/27/2015   Right parietal/occipital lobe hemorrhage, likely due to hemorrhagic transformation of a right PCA distribution infarct. Mass effect with extension of hemorrhage into the right lateral ventricle and probable developing hydrocephal  05/28/2015 IMPRESSION: 1. Size stable parenchymal hemorrhage in the posterior right cerebral hemisphere, again suggestive of hemorrhagic PCA territory infarct. Mass effect with 8mm midline shift is stable. 2. Stable intraventricular extension with right temporal horn obstruction. 3. Correlation with reported outside imaging would be useful. Otherwise, surveillance is recommended.   TTE - - Left ventricle: The cavity size was normal. Systolic function was normal. The estimated ejection fraction was in the range of 60% to 65%. Wall motion was normal; there were no regional wall motion abnormalities. The study was not technically sufficient to allow evaluation of LV diastolic dysfunction due to atrial fibrillation. - Aortic valve: There was no regurgitation. - Aortic root:  The aortic root was normal in size. - Mitral valve: Structurally normal valve. There was mild regurgitation. - Left atrium: The atrium was mildly dilated. - Right ventricle: The cavity size was normal. Wall thickness was normal. Systolic function was normal. - Right atrium: The atrium was normal in size. - Tricuspid valve: There was mild regurgitation. - Pulmonic valve: There was no regurgitation. - Pulmonary arteries: Systolic pressure was within the normal range. - Inferior vena cava: The vessel was normal in size. - Pericardium, extracardiac: There was no pericardial effusion.   PHYSICAL EXAM  Temp:  [97.8 F (36.6 C)-98.3 F (36.8 C)] 98 F (36.7 C) (01/04 1144) Pulse Rate:  [78-109] 86 (01/04 0500) Resp:  [12-26] 13 (01/04 0700) BP:  (129-152)/(84-114) 146/92 mmHg (01/04 0700) SpO2:  [95 %-100 %] 98 % (01/04 0700)  General - Well nourished, well developed, in no acute distress.  Ophthalmologic - Fundi not visualized due to distress.  Cardiovascular - Regular rate and rhythm.  Mental Status -  Level of arousal and orientation to time, place, and person were intact. Language including expression, naming, repetition, comprehension was assessed and found intact. Attention span and concentration were normal. Fund of Knowledge was assessed and was intact.  Cranial Nerves II - XII - II - left homonymous hemianopia. III, IV, VI - Extraocular movements intact. V - Facial sensation intact bilaterally. VII - Facial movement intact bilaterally. VIII - Hearing & vestibular intact bilaterally. X - Palate elevates symmetrically. XI - Chin turning & shoulder shrug intact bilaterally. XII - Tongue protrusion intact.  Motor Strength - The patient's strength was normal in all extremities and pronator drift was absent.  Bulk was normal and fasciculations were absent.   Motor Tone - Muscle tone was assessed at the neck and appendages and was normal.  Reflexes - The patient's reflexes were 1+ in all extremities and he had no pathological reflexes.  Sensory - Light touch, temperature/pinprick were assessed and were symmetrical.    Coordination - The patient had normal movements in the hands and feet with no ataxia or dysmetria.  Tremor was absent.  Gait and Station - not tested.   ASSESSMENT/PLAN [name] is a 60 y.o. 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  He did not receive IV t-PA due to hemorrhagic stroke.  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  Ongoing aggressive stroke risk factor management  Therapy recommendations: Pending  Disposition: Pending  Cerebral edema  Midline shift stable on CT head  On 3% saline @ 75  Na goal 150-160  Na Q6h  Recent right PCA infarct  With left hemianopia  Likely due to afib on on AC  Afib with RVR on eliquis PTA  Rate 90-115  Put  back on home metoprolol and cardizem  Hold off eliquis due to ICH  Discontinue ASA as home medication  Hypertension  Stable but on the high side  Off cardene  Put back on home metoprolol  Add lisinopril 20  Hyperlipidemia  Home meds:  No lipid lowering medications prior to admission  LDL 62, goal < 70  Continue statin at discharge  Diabetes  HgbA1c pending, goal < 7.0  SSI  Home meds with metformin and glipizide  CBG monitoring  Other Stroke Risk Factors  Advanced age  ETOH use  Hx stroke/TIA  Obstructive sleep apnea, on CPAP at home  Other Active Problems  HA - fioricet PRN  Nausea - zofran PRN  Hospital day # 2  This patient is critically ill due to large right occipitoparietal ICH, recent stroke, afib RVR and at significant risk of neurological worsening, death form cerebral edema, brain herniation, recurrent stroke and heart failure. This patient's care requires constant monitoring of vital signs, hemodynamics, respiratory and cardiac monitoring, review of multiple databases, neurological assessment, discussion  with family, other specialists and medical decision making of high complexity. I spent 35 minutes of neurocritical care time in the care of this patient.  Marvel Plan, MD PhD Stroke Neurology 05/29/2015 2:28 PM       To contact Stroke Continuity provider, please refer to [email or web address]. After hours, contact General Neurology

## 2015-05-29 NOTE — Progress Notes (Signed)
0500 Na 143. Notified Dr Leroy Kennedyamilo. Increased 3% to 2275ml/hr.  Makari Portman, [place], RN

## 2015-05-30 ENCOUNTER — Encounter (HOSPITAL_COMMUNITY): Payer: Self-pay | Admitting: Radiology

## 2015-05-30 ENCOUNTER — Inpatient Hospital Stay (HOSPITAL_COMMUNITY): Payer: Non-veteran care

## 2015-05-30 DIAGNOSIS — E785 Hyperlipidemia, unspecified: Secondary | ICD-10-CM

## 2015-05-30 DIAGNOSIS — I63431 Cerebral infarction due to embolism of right posterior cerebral artery: Secondary | ICD-10-CM

## 2015-05-30 DIAGNOSIS — I615 Nontraumatic intracerebral hemorrhage, intraventricular: Secondary | ICD-10-CM

## 2015-05-30 LAB — GLUCOSE, CAPILLARY
GLUCOSE-CAPILLARY: 66 mg/dL (ref 65–99)
GLUCOSE-CAPILLARY: 72 mg/dL (ref 65–99)
Glucose-Capillary: 135 mg/dL — ABNORMAL HIGH (ref 65–99)
Glucose-Capillary: 190 mg/dL — ABNORMAL HIGH (ref 65–99)
Glucose-Capillary: 295 mg/dL — ABNORMAL HIGH (ref 65–99)

## 2015-05-30 LAB — HEMOGLOBIN A1C
Hgb A1c MFr Bld: 7.6 % — ABNORMAL HIGH (ref 4.8–5.6)
Mean Plasma Glucose: 171 mg/dL

## 2015-05-30 LAB — SODIUM
Sodium: 148 mmol/L — ABNORMAL HIGH (ref 135–145)
Sodium: 143 mmol/L (ref 135–145)
Sodium: 143 mmol/L (ref 135–145)

## 2015-05-30 LAB — CBC
HCT: 39 % (ref 39.0–52.0)
Hemoglobin: 12.4 g/dL — ABNORMAL LOW (ref 13.0–17.0)
MCH: 24.7 pg — AB (ref 26.0–34.0)
MCHC: 31.8 g/dL (ref 30.0–36.0)
MCV: 77.7 fL — AB (ref 78.0–100.0)
PLATELETS: 302 10*3/uL (ref 150–400)
RBC: 5.02 MIL/uL (ref 4.22–5.81)
RDW: 14.4 % (ref 11.5–15.5)
WBC: 9.6 10*3/uL (ref 4.0–10.5)

## 2015-05-30 LAB — BASIC METABOLIC PANEL
Anion gap: 9 (ref 5–15)
BUN: 9 mg/dL (ref 6–20)
CALCIUM: 9.1 mg/dL (ref 8.9–10.3)
CO2: 24 mmol/L (ref 22–32)
CREATININE: 0.91 mg/dL (ref 0.61–1.24)
Chloride: 114 mmol/L — ABNORMAL HIGH (ref 101–111)
GFR calc Af Amer: >60 mL/min (ref >60)
GLUCOSE: 170 mg/dL — AB (ref 65–99)
Potassium: 3.5 mmol/L (ref 3.5–5.1)
Sodium: 147 mmol/L — ABNORMAL HIGH (ref 135–145)

## 2015-05-30 MED ORDER — METFORMIN HCL 850 MG PO TABS
850.0000 mg | ORAL_TABLET | Freq: Two times a day (BID) | ORAL | Status: DC
  Administered 2015-05-30 – 2015-06-11 (×24): 850 mg via ORAL
  Filled 2015-05-30 (×29): qty 1

## 2015-05-30 MED ORDER — DILTIAZEM HCL ER 120 MG PO CP24
120.0000 mg | ORAL_CAPSULE | Freq: Every day | ORAL | Status: DC
  Administered 2015-05-31 – 2015-06-11 (×12): 120 mg via ORAL
  Filled 2015-05-30 (×20): qty 1

## 2015-05-30 MED ORDER — GLIPIZIDE 5 MG PO TABS
10.0000 mg | ORAL_TABLET | Freq: Every day | ORAL | Status: DC
  Administered 2015-05-31 – 2015-06-11 (×12): 10 mg via ORAL
  Filled 2015-05-30 (×3): qty 2
  Filled 2015-05-30: qty 1
  Filled 2015-05-30: qty 2
  Filled 2015-05-30: qty 1
  Filled 2015-05-30 (×3): qty 2
  Filled 2015-05-30 (×2): qty 1
  Filled 2015-05-30 (×2): qty 2

## 2015-05-30 NOTE — Evaluation (Signed)
Physical Therapy Evaluation Patient Details Name: [name] MRN: [id number] DOB: 02-14-1956 Today's Date: 05/30/2015   History of Present Illness  pt presents with R Occipitoparietal ICH following recent R PCA Infarct.  pt with hx of A-fib, HTN, OSA, TKA, and DM.    Clinical Impression  Pt mobilizing at S to MinG level and needs cues for attending to objects on L side with visual deficits.  Pt admits to hitting his head on things at home due to not seeing them on his L side.  Discussed safety and need for increased attention to L side to avoid injuries.  Feel pt is moving well enough for return to home and OPPT f/u for balance and increased independence.      Follow Up Recommendations Outpatient PT;Supervision - Intermittent    Equipment Recommendations  None recommended by PT    Recommendations for Other Services       Precautions / Restrictions Precautions Precautions: Fall Precaution Comments: L Hemianopsia from recent R PCA Infarct.   Restrictions Weight Bearing Restrictions: No      Mobility  Bed Mobility Overal bed mobility: Needs Assistance Bed Mobility: Supine to Sit     Supine to sit: Supervision     General bed mobility comments: Cues for attending to lines and linens on L side due to hemianopsia.    Transfers Overall transfer level: Needs assistance Equipment used: None Transfers: Sit to/from Stand Sit to Stand: Supervision         General transfer comment: pt with decreased attention on L side.    Ambulation/Gait Ambulation/Gait assistance: Min guard Ambulation Distance (Feet): 150 Feet Assistive device: None Gait Pattern/deviations: Step-through pattern;Decreased stride length;Antalgic;Shuffle     General Gait Details: pt moves slowly and mild shuffle.  pt does favor L LE, which he and wife indicate is baseline for pt.  pt does need cues to attend to L side as he tends to run into objects.    Stairs            Wheelchair Mobility     Modified Rankin (Stroke Patients Only) Modified Rankin (Stroke Patients Only) Pre-Morbid Rankin Score: Slight disability Modified Rankin: Moderately severe disability     Balance Overall balance assessment: Needs assistance Sitting-balance support: No upper extremity supported;Feet supported Sitting balance-Leahy Scale: Good     Standing balance support: No upper extremity supported;During functional activity Standing balance-Leahy Scale: Fair Standing balance comment: Difficulty with balance challenges.                               Pertinent Vitals/Pain Pain Assessment: No/denies pain    Home Living Family/patient expects to be discharged to:: Private residence Living Arrangements: Spouse/significant other Available Help at Discharge: Family;Available PRN/intermittently Type of Home: House Home Access: Ramped entrance     Home Layout: One level Home Equipment: Shower seat      Prior Function Level of Independence: Independent               Hand Dominance        Extremity/Trunk Assessment   Upper Extremity Assessment: Defer to OT evaluation           Lower Extremity Assessment: LLE deficits/detail   LLE Deficits / Details: Mild ROM and strength deficits, which pt states are baseline since TKA in August.  No Sensation deficits.  Cervical / Trunk Assessment: Normal  Communication   Communication: No difficulties  Cognition Arousal/Alertness: Awake/alert  Behavior During Therapy: WFL for tasks assessed/performed Overall Cognitive Status: Within Functional Limits for tasks assessed                      General Comments      Exercises        Assessment/Plan    PT Assessment Patient needs continued PT services  PT Diagnosis Difficulty walking   PT Problem List Decreased strength;Decreased range of motion;Decreased activity tolerance;Decreased balance;Decreased mobility;Decreased coordination;Decreased safety awareness   PT Treatment Interventions DME instruction;Gait training;Functional mobility training;Therapeutic activities;Therapeutic exercise;Balance training;Neuromuscular re-education;Patient/family education   PT Goals (Current goals can be found in the Care Plan section) Acute Rehab PT Goals Patient Stated Goal: To get back walking daily. PT Goal Formulation: With patient Time For Goal Achievement: 06/06/15 Potential to Achieve Goals: Good    Frequency Min 4X/week   Barriers to discharge        Co-evaluation               End of Session Equipment Utilized During Treatment: Gait belt Activity Tolerance: Patient tolerated treatment well Patient left: in chair;with call bell/phone within reach;with chair alarm set;with family/visitor present Nurse Communication: Mobility status         Time: 8469-6295 PT Time Calculation (min) (ACUTE ONLY): 25 min   Charges:   PT Evaluation $PT Eval Moderate Complexity: 1 Procedure PT Treatments $Gait Training: 8-22 mins   PT G CodesSunny Schlein, [place] [phone] 05/30/2015, 10:13 AM

## 2015-05-30 NOTE — Progress Notes (Signed)
STROKE TEAM PROGRESS NOTE   SUBJECTIVE (INTERVAL HISTORY) His wife is at the bedside. He is sitting in chair, eating well. Overall his condition is stable. On 3% saline Na 148. BP stable off cardene, on metoprolol, lisinopril and cardizem. HR 80s. Repeat CT in am showed stable hematoma, ventricle size and midline shift.   OBJECTIVE Temp:  [97.3 F (36.3 C)-98.7 F (37.1 C)] 98.7 F (37.1 C) (01/05 0800) Pulse Rate:  [54-100] 66 (01/05 0700) Cardiac Rhythm:  [-] Atrial fibrillation (01/05 0000) Resp:  [14-25] 16 (01/05 0700) BP: (123-158)/(86-111) 142/91 mmHg (01/05 0700) SpO2:  [93 %-100 %] 100 % (01/05 0700)  CBC:  Recent Labs Lab 05/27/15 1630  05/29/15 0510 05/30/15 0450  WBC 8.5  < > 8.3 9.6  NEUTROABS 5.9  --   --   --   HGB 13.3  < > 13.8 12.4*  HCT 42.0  < > 43.0 39.0  MCV 77.2*  < > 76.9* 77.7*  PLT 372  < > 318 302  < > = values in this interval not displayed.  Basic Metabolic Panel:  Recent Labs Lab 05/29/15 0510  05/29/15 2215 05/30/15 0450  NA 143  < > 146* 147*  148*  K 4.1  --   --  3.5  CL 107  --   --  114*  CO2 26  --   --  24  GLUCOSE 158*  --   --  170*  BUN 8  --   --  9  CREATININE 1.01  --   --  0.91  CALCIUM 9.5  --   --  9.1  < > = values in this interval not displayed.  Lipid Panel:     Component Value Date/Time   CHOL 111 05/29/2015 0510   TRIG 72 05/29/2015 0510   HDL 35* 05/29/2015 0510   CHOLHDL 3.2 05/29/2015 0510   VLDL 14 05/29/2015 0510   LDLCALC 62 05/29/2015 0510   HgbA1c:  Lab Results  Component Value Date   HGBA1C 7.6* 05/29/2015   Urine Drug Screen:     Component Value Date/Time   LABOPIA POSITIVE* 05/27/2015 1944   COCAINSCRNUR NONE DETECTED 05/27/2015 1944   LABBENZ NONE DETECTED 05/27/2015 1944   AMPHETMU NONE DETECTED 05/27/2015 1944   THCU NONE DETECTED 05/27/2015 1944   LABBARB NONE DETECTED 05/27/2015 1944      IMAGING I have personally reviewed the radiological images below and agree with the  radiology interpretations.  Ct Angio Head and Neck W/cm &/or Wo Cm  05/27/2015   Aborted study due to extravasation of IV contrast into the arm. Study will perhaps be attempted at a later time.   Ct Head Wo Contrast 05/27/2015   Right parietal/occipital lobe hemorrhage, likely due to hemorrhagic transformation of a right PCA distribution infarct. Mass effect with extension of hemorrhage into the right lateral ventricle and probable developing hydrocephal  05/28/2015 IMPRESSION: 1. Size stable parenchymal hemorrhage in the posterior right cerebral hemisphere, again suggestive of hemorrhagic PCA territory infarct. Mass effect with 8mm midline shift is stable. 2. Stable intraventricular extension with right temporal horn obstruction. 3. Correlation with reported outside imaging would be useful. Otherwise, surveillance is recommended.   05/30/2015  IMPRESSION: 1. Large right parieto-occipital intraparenchymal hemorrhage has increased mildly in size, measuring 8.4 x 5.0 cm, with mildly worsened surrounding edema. The degree of leftward midline shift is relatively stable, measuring 7-8 mm. 2. Mild dilatation of the lateral ventricles in comparison to the prior study, concerning  for mild obstructive hydrocephalus, with new blood noted filling the third ventricle. 3. Mild mass effect about the right cerebral peduncle, without evidence for transtentorial herniation at this time. By my reading, the hematoma is the same as two days ago.   TTE - - Left ventricle: The cavity size was normal. Systolic function was normal. The estimated ejection fraction was in the range of 60% to 65%. Wall motion was normal; there were no regional wall motion abnormalities. The study was not technically sufficient to allow evaluation of LV diastolic dysfunction due to atrial fibrillation. - Aortic valve: There was no regurgitation. - Aortic root: The aortic root was normal in size. - Mitral valve: Structurally normal  valve. There was mild regurgitation. - Left atrium: The atrium was mildly dilated. - Right ventricle: The cavity size was normal. Wall thickness was normal. Systolic function was normal. - Right atrium: The atrium was normal in size. - Tricuspid valve: There was mild regurgitation. - Pulmonic valve: There was no regurgitation. - Pulmonary arteries: Systolic pressure was within the normal range. - Inferior vena cava: The vessel was normal in size. - Pericardium, extracardiac: There was no pericardial effusion.   PHYSICAL EXAM  Temp:  [97.3 F (36.3 C)-98.7 F (37.1 C)] 98.7 F (37.1 C) (01/05 0800) Pulse Rate:  [54-100] 66 (01/05 0700) Resp:  [14-25] 16 (01/05 0700) BP: (123-158)/(86-111) 142/91 mmHg (01/05 0700) SpO2:  [93 %-100 %] 100 % (01/05 0700)  General - Well nourished, well developed, in no acute distress.  Ophthalmologic - Fundi not visualized due to distress.  Cardiovascular - Regular rate and rhythm.  Mental Status -  Level of arousal and orientation to time, place, and person were intact. Language including expression, naming, repetition, comprehension was assessed and found intact. Attention span and concentration were normal. Fund of Knowledge was assessed and was intact.  Cranial Nerves II - XII - II - left homonymous hemianopia. III, IV, VI - Extraocular movements intact. V - Facial sensation intact bilaterally. VII - Facial movement intact bilaterally. VIII - Hearing & vestibular intact bilaterally. X - Palate elevates symmetrically. XI - Chin turning & shoulder shrug intact bilaterally. XII - Tongue protrusion intact.  Motor Strength - The patient's strength was normal in all extremities and pronator drift was absent.  Bulk was normal and fasciculations were absent.   Motor Tone - Muscle tone was assessed at the neck and appendages and was normal.  Reflexes - The patient's reflexes were 1+ in all extremities and he had no pathological  reflexes.  Sensory - Light touch, temperature/pinprick were assessed and were symmetrical.    Coordination - The patient had normal movements in the hands and feet with no ataxia or dysmetria.  Tremor was absent.  Gait and Station - not tested.   ASSESSMENT/PLAN [name] is a 60 y.o. 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  He did not receive IV t-PA due to hemorrhagic stroke.  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   Ongoing aggressive stroke risk factor management  Therapy recommendations: Out pt PT  Disposition: Pending  Cerebral edema  Midline shift stable on CT head  On 3% saline @ 75  Consider to taper off tomorrow  Na goal 150-160  Na Q6h  Recent right PCA infarct  With left hemianopia  Likely due to afib on on AC  Afib with RVR on eliquis PTA  Rate 90-115  Put back on home metoprolol and cardizem  Hold off eliquis due to ICH  Discontinue ASA as home medication  Hypertension  Stable but on the high side  Off cardene  Put back on home metoprolol and cardizem  Add lisinopril 20  Hyperlipidemia  Home meds:  No lipid lowering medications prior to admission  LDL 62, goal <  70  Continue statin at discharge  Diabetes  HgbA1c pending, goal < 7.0  SSI  Resume home meds with metformin and glipizide  CBG monitoring  Other Stroke Risk Factors  Advanced age  ETOH use  Hx stroke/TIA  Obstructive sleep apnea, on CPAP at home  Other Active Problems  HA - fioricet PRN  Nausea - zofran PRN  Hospital day # 3  This patient is critically ill due to large right occipitoparietal ICH, recent stroke, afib RVR and at significant risk of neurological worsening, death form cerebral edema, brain herniation, recurrent stroke and heart failure. This patient's care requires constant monitoring of vital signs, hemodynamics, respiratory and cardiac monitoring, review of multiple databases, neurological assessment, discussion with family, other specialists and medical decision making of high complexity. I spent 35 minutes of neurocritical care time in the care of this patient.  Marvel Plan, MD PhD Stroke Neurology 05/30/2015 9:37 AM       To contact Stroke Continuity provider, please refer to [email or web address]. After hours, contact General Neurology

## 2015-05-30 NOTE — Progress Notes (Signed)
NA level at 1850 was 143. Pt has not reached goal since being on 3% and Na trending down. Notified Dr Leroy Kennedyamilo. Pt with no history of CHF or renal failure. Order received to increase rate to 12700ml/hr.  Samar Dass, [place] Marie, RN 05/30/2015 7:39 PM

## 2015-05-30 NOTE — Evaluation (Signed)
Occupational Therapy Evaluation Patient Details Name: [name] MRN: [id number] DOB: [birth year] Today's Date: 05/30/2015    History of Present Illness pt presents with probable hemorrhagic transformation of prior PCA infarct 05/14/15 - head CT showed:  Large Rt parieto-occipital intraparenchymal hemorrhage, mild dilation of lateral ventricles, mild mass effect..  pt with hx of A-fib, HTN, OSA, TKA, and DM.    Clinical Impression   Pt admitted with above. He demonstrates the below listed deficits and will benefit from continued OT to maximize safety and independence with BADLs.  Pt presents with dense Lt HH, with inattention/neglect, and mild nystagmus noted with Lt gaze.  Pt also demonstrates impaired awareness and safety, decreased initiation, and decreased problem solving. He will require 24 hour supervision at discharge, and OPOT.  Recommend no driving, and recommend follow up with neuro-ophthalmologist as he needs full automated perimetry (humphrey) field testing.   Will follow.   d    Follow Up Recommendations  Outpatient OT;Supervision/Assistance - 24 hour    Equipment Recommendations  None recommended by OT    Recommendations for Other Services       Precautions / Restrictions Precautions Precautions: Fall Precaution Comments: L Hemianopsia from recent R PCA Infarct.        Mobility Bed Mobility                  Transfers Overall transfer level: Needs assistance Equipment used: None Transfers: Sit to/from Stand;Stand Pivot Transfers Sit to Stand: Supervision Stand pivot transfers: Min guard            Balance Overall balance assessment: Needs assistance Sitting-balance support: Feet supported Sitting balance-Leahy Scale: Good     Standing balance support: No upper extremity supported Standing balance-Leahy Scale: Fair                              ADL Overall ADL's : Needs assistance/impaired Eating/Feeding: Supervision/  safety;Sitting Eating/Feeding Details (indicate cue type and reason): Upon therapist's entrance, pt sitting staring at his tray making no attempt to eat.  When cued, he ate items on the Rt.  Required mod cues to locate items on his lt.  Uses spoon instead of fork and unable to recognize error, even when experiencing difficulty manipulating food items on the spoon.  Pt will periodically stop feeding himself, and require cues to re initiate  Grooming: Wash/dry hands;Wash/dry face;Oral care;Minimal assistance;Sitting;Standing   Upper Body Bathing: Minimal assitance;Sitting   Lower Body Bathing: Minimal assistance;Sit to/from stand   Upper Body Dressing : Minimal assistance;Sitting   Lower Body Dressing: Minimal assistance;Sit to/from stand   Toilet Transfer: Min guard;Ambulation;Comfort height toilet   Toileting- Clothing Manipulation and Hygiene: Minimal assistance;Sit to/from stand       Functional mobility during ADLs: Min guard General ADL Comments: Pt requires min A due to cognitive and visual deficits      Vision Vision Assessment?: Yes Eye Alignment: Within Functional Limits Ocular Range of Motion: Within Functional Limits Tracking/Visual Pursuits: Decreased smoothness of eye movement to LEFT inferior field;Decreased smoothness of horizontal tracking;Decreased smoothness of eye movement to LEFT superior field Visual Fields: Left homonymous hemianopsia Additional Comments: Pt with dense Lt HH.  horizontal nystagmus noted with Lt gaze    Perception Perception Perception Tested?: Yes Perception Deficits: Inattention/neglect Inattention/Neglect: Does not attend to left visual field;Does not attend to left side of body Spatial deficits: Pt consistenly misses items on his Lt,    Praxis Praxis  Praxis tested?: Within functional limits Deficits: Initiation    Pertinent Vitals/Pain Pain Assessment: No/denies pain     Hand Dominance Right   Extremity/Trunk Assessment Upper  Extremity Assessment Upper Extremity Assessment: LUE deficits/detail LUE Deficits / Details: grossly 4+/5   Lower Extremity Assessment Lower Extremity Assessment: Defer to PT evaluation   Cervical / Trunk Assessment Cervical / Trunk Assessment: Normal   Communication Communication Communication: No difficulties   Cognition Arousal/Alertness: Awake/alert Behavior During Therapy: WFL for tasks assessed/performed Overall Cognitive Status: Impaired/Different from baseline Area of Impairment: Attention;Safety/judgement;Problem solving   Current Attention Level: Sustained (with cues)     Safety/Judgement: Decreased awareness of safety;Decreased awareness of deficits   Problem Solving: Slow processing;Decreased initiation;Difficulty sequencing;Requires verbal cues General Comments: Pt was sitting staring at his lunch tray, but made no move to initiate eating until prompted.  Pt starts eating, then stops requiring cues to reinitiate task.  At times pt is able to redirect himself to task, but required mod cues overall to complete task.     General Comments       Exercises       Shoulder Instructions      Home Living Family/patient expects to be discharged to:: Private residence Living Arrangements: Spouse/significant other Available Help at Discharge: Family;Available PRN/intermittently Type of Home: House Home Access: Ramped entrance     Home Layout: One level     Bathroom Shower/Tub: Producer, television/film/video: Standard     Home Equipment: Shower seat          Prior Functioning/Environment Level of Independence: Needs assistance  Gait / Transfers Assistance Needed: independent ADL's / Homemaking Assistance Needed: Since CVA in Dec, pt requires supervision for ADLs and assist with IADLs.  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     OT Diagnosis: Generalized weakness;Cognitive deficits;Disturbance of vision   OT Problem List:  Decreased strength;Decreased activity tolerance;Impaired balance (sitting and/or standing);Impaired vision/perception;Decreased cognition;Decreased safety awareness;Decreased knowledge of use of DME or AE   OT Treatment/Interventions: Self-care/ADL training;DME and/or AE instruction;Therapeutic activities;Cognitive remediation/compensation;Visual/perceptual remediation/compensation;Patient/family education;Balance training    OT Goals(Current goals can be found in the care plan section) Acute Rehab OT Goals Patient Stated Goal: to improve my vision  OT Goal Formulation: With patient Time For Goal Achievement: 06/13/15 Potential to Achieve Goals: Good ADL Goals Pt Will Perform Grooming: with supervision;standing Pt Will Perform Upper Body Bathing: with supervision;sitting;standing Pt Will Perform Lower Body Bathing: with supervision;sit to/from stand Pt Will Perform Upper Body Dressing: with supervision;sitting;standing Pt Will Perform Lower Body Dressing: with supervision;sit to/from stand Pt Will Transfer to Toilet: with supervision;ambulating;regular height toilet Pt Will Perform Toileting - Clothing Manipulation and hygiene: with supervision;sit to/from stand Additional ADL Goal #1: Pt will locate items on Lt during ADL tasks with min cues   OT Frequency: Min 3X/week   Barriers to D/C:            Co-evaluation              End of Session    Activity Tolerance: Patient tolerated treatment well Patient left: in chair;with call bell/phone within reach   Time: 1610-9604 OT Time Calculation (min): 36 min Charges:  OT General Charges $OT Visit: 1 Procedure OT Evaluation $OT Eval Moderate Complexity: 1 Procedure OT Treatments $Self Care/Home Management : 8-22 mins G-Codes:    Inaara Tye M 06/16/15, 4:17 PM

## 2015-05-30 NOTE — Progress Notes (Signed)
Met with pt's wife to discuss [facility] status.  Wife states pt is 100% with the [place], and sees physicians at [facility] and [place].  Will notify [facility] of pt admission, and have pt/wife sign [facility] refusal form, as wife states they prefer to stay at [facility] at this time for all medical treatment.    Reinaldo Raddle, RN, BSN  Trauma/Neuro ICU Case Manager [phone]

## 2015-05-30 NOTE — Progress Notes (Signed)
Inpatient Diabetes Program Recommendations  AACE/ADA: New Consensus Statement on Inpatient Glycemic Control (2015)  Target Ranges:  Prepandial:   less than 140 mg/dL      Peak postprandial:   less than 180 mg/dL (1-2 hours)      Critically ill patients:  140 - 180 mg/dL   Review of Glycemic Control:  Results for [name], [name] (MRN [id number]) as of 05/30/2015 14:00  Ref. Range 05/29/2015 11:43 05/29/2015 17:06 05/29/2015 22:08 05/30/2015 08:10 05/30/2015 12:21  Glucose-Capillary Latest Ref Range: 65-99 mg/dL 409260 (H) 811220 (H) 71 914135 (H) 190 (H)   Results for [name], [name] (MRN [id number]) as of 05/30/2015 14:00  Ref. Range 05/29/2015 05:10  Hemoglobin A1C Latest Ref Range: 4.8-5.6 % 7.6 (H)   Diabetes history: Type 2 diabetes Outpatient Diabetes medications: Glucotrol 10 mg bid, Metformin 850 mg bid Current orders for Inpatient glycemic control:  TCTS correction tid with meals and HS, Glucotrol 10 mg with breakfast, Metformin 850 mg bid  Inpatient Diabetes Program Recommendations:    Please consider changing Novolog correction to moderate tid with meals and HS scale.    Thanks, Beryl MeagerJenny Makaiyah Schweiger, RN, BC-ADM Inpatient Diabetes Coordinator Pager [phone] (8a-5p)

## 2015-05-31 ENCOUNTER — Inpatient Hospital Stay (HOSPITAL_COMMUNITY): Payer: Non-veteran care | Admitting: Anesthesiology

## 2015-05-31 ENCOUNTER — Encounter (HOSPITAL_COMMUNITY)
Admission: EM | Disposition: A | Discharge status: Skilled Nursing Facility | Payer: Self-pay | Source: Home / Self Care | Attending: Neurology

## 2015-05-31 ENCOUNTER — Inpatient Hospital Stay (HOSPITAL_COMMUNITY): Payer: Non-veteran care

## 2015-05-31 HISTORY — PX: CRANIOTOMY: SHX93

## 2015-05-31 LAB — CBC
HCT: 36.5 % — ABNORMAL LOW (ref 39.0–52.0)
Hemoglobin: 11.4 g/dL — ABNORMAL LOW (ref 13.0–17.0)
MCH: 24.5 pg — AB (ref 26.0–34.0)
MCHC: 31.2 g/dL (ref 30.0–36.0)
MCV: 78.3 fL (ref 78.0–100.0)
PLATELETS: 263 10*3/uL (ref 150–400)
RBC: 4.66 MIL/uL (ref 4.22–5.81)
RDW: 14.5 % (ref 11.5–15.5)
WBC: 9.8 10*3/uL (ref 4.0–10.5)

## 2015-05-31 LAB — BASIC METABOLIC PANEL
Anion gap: 8 (ref 5–15)
CALCIUM: 8.7 mg/dL — AB (ref 8.9–10.3)
CO2: 26 mmol/L (ref 22–32)
Chloride: 109 mmol/L (ref 101–111)
Creatinine, Ser: 0.76 mg/dL (ref 0.61–1.24)
GFR calc Af Amer: >60 mL/min (ref >60)
Glucose, Bld: 134 mg/dL — ABNORMAL HIGH (ref 65–99)
POTASSIUM: 3.2 mmol/L — AB (ref 3.5–5.1)
SODIUM: 143 mmol/L (ref 135–145)

## 2015-05-31 LAB — SODIUM
SODIUM: 144 mmol/L (ref 135–145)
SODIUM: 148 mmol/L — AB (ref 135–145)
Sodium: 143 mmol/L (ref 135–145)

## 2015-05-31 LAB — GLUCOSE, CAPILLARY
GLUCOSE-CAPILLARY: 93 mg/dL (ref 65–99)
Glucose-Capillary: 116 mg/dL — ABNORMAL HIGH (ref 65–99)
Glucose-Capillary: 172 mg/dL — ABNORMAL HIGH (ref 65–99)
Glucose-Capillary: 99 mg/dL (ref 65–99)

## 2015-05-31 SURGERY — CRANIOTOMY HEMATOMA EVACUATION SUBDURAL
Anesthesia: General | Site: Head | Laterality: Right

## 2015-05-31 MED ORDER — PROMETHAZINE HCL 25 MG PO TABS: 12.5000 mg | ORAL_TABLET | ORAL | Status: DC | PRN

## 2015-05-31 MED ORDER — GLYCOPYRROLATE 0.2 MG/ML IJ SOLN
INTRAMUSCULAR | Status: DC | PRN
  Administered 2015-05-31: 0.6 mg via INTRAVENOUS

## 2015-05-31 MED ORDER — ONDANSETRON HCL 4 MG/2ML IJ SOLN
4.0000 mg | Freq: Four times a day (QID) | INTRAMUSCULAR | Status: DC | PRN
Start: 2015-05-31 — End: 2015-05-31

## 2015-05-31 MED ORDER — ONDANSETRON HCL 4 MG/2ML IJ SOLN
INTRAMUSCULAR | Status: DC | PRN
  Administered 2015-05-31: 4 mg via INTRAVENOUS

## 2015-05-31 MED ORDER — BISACODYL 10 MG RE SUPP: 10.0000 mg | Freq: Every day | RECTAL | Status: DC | PRN

## 2015-05-31 MED ORDER — LABETALOL HCL 5 MG/ML IV SOLN
INTRAVENOUS | Status: AC
  Filled 2015-05-31: qty 4

## 2015-05-31 MED ORDER — DOCUSATE SODIUM 100 MG PO CAPS
100.0000 mg | ORAL_CAPSULE | Freq: Two times a day (BID) | ORAL | Status: DC
  Administered 2015-06-01 – 2015-06-11 (×21): 100 mg via ORAL
  Filled 2015-05-31 (×22): qty 1

## 2015-05-31 MED ORDER — SODIUM CHLORIDE 0.9 % IR SOLN
Status: DC | PRN
  Administered 2015-05-31: 500 mL

## 2015-05-31 MED ORDER — PROPOFOL 10 MG/ML IV BOLUS
INTRAVENOUS | Status: DC | PRN
  Administered 2015-05-31: 100 mg via INTRAVENOUS
  Administered 2015-05-31: 160 mg via INTRAVENOUS

## 2015-05-31 MED ORDER — PHENYLEPHRINE HCL 10 MG/ML IJ SOLN
INTRAMUSCULAR | Status: DC | PRN
  Administered 2015-05-31 (×2): 40 ug via INTRAVENOUS
  Administered 2015-05-31: 80 ug via INTRAVENOUS
  Administered 2015-05-31: 40 ug via INTRAVENOUS

## 2015-05-31 MED ORDER — VANCOMYCIN HCL IN DEXTROSE 1-5 GM/200ML-% IV SOLN
INTRAVENOUS | Status: AC
  Filled 2015-05-31: qty 200

## 2015-05-31 MED ORDER — GLYCOPYRROLATE 0.2 MG/ML IJ SOLN
INTRAMUSCULAR | Status: AC
  Filled 2015-05-31: qty 3

## 2015-05-31 MED ORDER — POLYETHYLENE GLYCOL 3350 17 G PO PACK
17.0000 g | PACK | Freq: Every day | ORAL | Status: DC | PRN
  Administered 2015-06-08 – 2015-06-10 (×2): 17 g via ORAL
  Filled 2015-05-31 (×2): qty 1

## 2015-05-31 MED ORDER — HYDROMORPHONE HCL 1 MG/ML IJ SOLN
0.2500 mg | INTRAMUSCULAR | Status: DC | PRN
  Administered 2015-05-31 (×4): 0.5 mg via INTRAVENOUS

## 2015-05-31 MED ORDER — LABETALOL HCL 5 MG/ML IV SOLN
INTRAVENOUS | Status: DC | PRN
  Administered 2015-05-31: 5 mg via INTRAVENOUS
  Administered 2015-05-31: 10 mg via INTRAVENOUS
  Administered 2015-05-31: 5 mg via INTRAVENOUS

## 2015-05-31 MED ORDER — OXYCODONE HCL 5 MG PO TABS: 5.0000 mg | ORAL_TABLET | Freq: Once | ORAL | Status: DC | PRN

## 2015-05-31 MED ORDER — SODIUM CHLORIDE 0.9 % IV SOLN
INTRAVENOUS | Status: DC | PRN
  Administered 2015-05-31: 20:00:00 via INTRAVENOUS

## 2015-05-31 MED ORDER — HYDROMORPHONE HCL 1 MG/ML IJ SOLN: 0.2500 mg | INTRAMUSCULAR | Status: DC | PRN

## 2015-05-31 MED ORDER — THROMBIN 20000 UNITS EX SOLR
CUTANEOUS | Status: DC | PRN
  Administered 2015-05-31: 20 mL via TOPICAL

## 2015-05-31 MED ORDER — ROCURONIUM BROMIDE 100 MG/10ML IV SOLN
INTRAVENOUS | Status: DC | PRN
  Administered 2015-05-31: 10 mg via INTRAVENOUS
  Administered 2015-05-31: 40 mg via INTRAVENOUS

## 2015-05-31 MED ORDER — DEXTROSE 5 % IV SOLN
1000.0000 mg | INTRAVENOUS | Status: DC | PRN
  Administered 2015-05-31: 1000 mg via INTRAVENOUS

## 2015-05-31 MED ORDER — SODIUM CHLORIDE 0.9 % IV SOLN
INTRAVENOUS | Status: DC | PRN
  Administered 2015-05-31: 21:00:00 via INTRAVENOUS

## 2015-05-31 MED ORDER — ONDANSETRON HCL 4 MG PO TABS
4.0000 mg | ORAL_TABLET | ORAL | Status: DC | PRN
  Administered 2015-06-02: 4 mg via ORAL
  Filled 2015-05-31: qty 1

## 2015-05-31 MED ORDER — ONDANSETRON HCL 4 MG/2ML IJ SOLN
4.0000 mg | INTRAMUSCULAR | Status: DC | PRN
  Administered 2015-06-01 (×3): 4 mg via INTRAVENOUS
  Filled 2015-05-31 (×3): qty 2

## 2015-05-31 MED ORDER — HYDROMORPHONE HCL 1 MG/ML IJ SOLN
INTRAMUSCULAR | Status: AC
  Filled 2015-05-31: qty 1

## 2015-05-31 MED ORDER — HYDROCODONE-ACETAMINOPHEN 10-325 MG PO TABS
1.0000 | ORAL_TABLET | Freq: Four times a day (QID) | ORAL | Status: DC | PRN
  Administered 2015-06-01: 1 via ORAL
  Filled 2015-05-31 (×2): qty 1

## 2015-05-31 MED ORDER — PHENYLEPHRINE HCL 10 MG/ML IJ SOLN
10.0000 mg | INTRAVENOUS | Status: DC | PRN
  Administered 2015-05-31: 50 ug/min via INTRAVENOUS

## 2015-05-31 MED ORDER — MICROFIBRILLAR COLL HEMOSTAT EX PADS
MEDICATED_PAD | CUTANEOUS | Status: DC | PRN
  Administered 2015-05-31: 1 via TOPICAL

## 2015-05-31 MED ORDER — ONDANSETRON HCL 4 MG/2ML IJ SOLN
INTRAMUSCULAR | Status: AC
  Filled 2015-05-31: qty 4

## 2015-05-31 MED ORDER — OXYCODONE HCL 5 MG/5ML PO SOLN: 5.0000 mg | Freq: Once | ORAL | Status: DC | PRN

## 2015-05-31 MED ORDER — ESMOLOL HCL 100 MG/10ML IV SOLN
INTRAVENOUS | Status: DC | PRN
  Administered 2015-05-31 (×2): 10 mg via INTRAVENOUS

## 2015-05-31 MED ORDER — POTASSIUM CHLORIDE CRYS ER 20 MEQ PO TBCR
40.0000 meq | EXTENDED_RELEASE_TABLET | ORAL | Status: AC
  Administered 2015-05-31 (×2): 40 meq via ORAL
  Filled 2015-05-31 (×2): qty 2

## 2015-05-31 MED ORDER — LIDOCAINE-EPINEPHRINE 1 %-1:100000 IJ SOLN
INTRAMUSCULAR | Status: DC | PRN
  Administered 2015-05-31: 5 mL via INTRADERMAL

## 2015-05-31 MED ORDER — NEOSTIGMINE METHYLSULFATE 10 MG/10ML IV SOLN
INTRAVENOUS | Status: AC
  Filled 2015-05-31: qty 1

## 2015-05-31 MED ORDER — SUCCINYLCHOLINE CHLORIDE 20 MG/ML IJ SOLN
INTRAMUSCULAR | Status: AC
  Filled 2015-05-31: qty 1

## 2015-05-31 MED ORDER — NEOSTIGMINE METHYLSULFATE 10 MG/10ML IV SOLN
INTRAVENOUS | Status: DC | PRN
Start: 2015-05-31 — End: 2015-05-31
  Administered 2015-05-31: 4 mg via INTRAVENOUS

## 2015-05-31 MED ORDER — SENNA 8.6 MG PO TABS
1.0000 | ORAL_TABLET | Freq: Two times a day (BID) | ORAL | Status: DC
  Administered 2015-06-01 – 2015-06-11 (×20): 8.6 mg via ORAL
  Filled 2015-05-31 (×21): qty 1

## 2015-05-31 MED ORDER — FENTANYL CITRATE (PF) 250 MCG/5ML IJ SOLN
INTRAMUSCULAR | Status: DC | PRN
  Administered 2015-05-31 (×2): 100 ug via INTRAVENOUS
  Administered 2015-05-31: 50 ug via INTRAVENOUS

## 2015-05-31 MED ORDER — 0.9 % SODIUM CHLORIDE (POUR BTL) OPTIME
TOPICAL | Status: DC | PRN
  Administered 2015-05-31: 1000 mL

## 2015-05-31 MED ORDER — LIDOCAINE HCL (CARDIAC) 20 MG/ML IV SOLN
INTRAVENOUS | Status: AC
  Filled 2015-05-31: qty 5

## 2015-05-31 MED ORDER — PANTOPRAZOLE SODIUM 40 MG IV SOLR
40.0000 mg | Freq: Every day | INTRAVENOUS | Status: DC
  Administered 2015-06-01 – 2015-06-02 (×3): 40 mg via INTRAVENOUS
  Filled 2015-05-31 (×4): qty 40

## 2015-05-31 MED ORDER — SODIUM CHLORIDE 0.9 % IV SOLN
500.0000 mg | Freq: Two times a day (BID) | INTRAVENOUS | Status: DC
  Administered 2015-06-01 – 2015-06-03 (×6): 500 mg via INTRAVENOUS
  Filled 2015-05-31 (×7): qty 5

## 2015-05-31 MED ORDER — HYDROCODONE-ACETAMINOPHEN 5-325 MG PO TABS
1.0000 | ORAL_TABLET | ORAL | Status: DC | PRN
  Administered 2015-06-01: 1 via ORAL
  Filled 2015-05-31: qty 1

## 2015-05-31 MED ORDER — FENTANYL CITRATE (PF) 250 MCG/5ML IJ SOLN
INTRAMUSCULAR | Status: AC
  Filled 2015-05-31: qty 5

## 2015-05-31 MED ORDER — BUPIVACAINE HCL (PF) 0.5 % IJ SOLN
INTRAMUSCULAR | Status: DC | PRN
  Administered 2015-05-31: 5 mL

## 2015-05-31 MED ORDER — LIDOCAINE HCL 4 % EX SOLN
CUTANEOUS | Status: DC | PRN
  Administered 2015-05-31: 2 mL via TOPICAL

## 2015-05-31 MED ORDER — LABETALOL HCL 5 MG/ML IV SOLN
10.0000 mg | INTRAVENOUS | Status: DC | PRN
  Administered 2015-05-31: 10 mg via INTRAVENOUS
  Administered 2015-06-01 (×6): 20 mg via INTRAVENOUS
  Filled 2015-05-31 (×6): qty 4

## 2015-05-31 MED ORDER — GABAPENTIN 300 MG PO CAPS
600.0000 mg | ORAL_CAPSULE | Freq: Four times a day (QID) | ORAL | Status: DC
  Administered 2015-05-31 – 2015-06-11 (×43): 600 mg via ORAL
  Filled 2015-05-31 (×42): qty 2

## 2015-05-31 SURGICAL SUPPLY — 74 items
BAG DECANTER FOR FLEXI CONT (MISCELLANEOUS) ×3 IMPLANT
BIT DRILL WIRE PASS 1.3MM (BIT) IMPLANT
BNDG GAUZE ELAST 4 BULKY (GAUZE/BANDAGES/DRESSINGS) ×3 IMPLANT
BRUSH SCRUB EZ PLAIN DRY (MISCELLANEOUS) ×3 IMPLANT
BUR ACORN 6.0 (BURR) ×2 IMPLANT
BUR ACORN 6.0MM (BURR) ×1
BUR ADDG 1.1 (BURR) IMPLANT
BUR ADDG 1.1MM (BURR)
BUR SPIRAL ROUTER 2.3 (BUR) IMPLANT
BUR SPIRAL ROUTER 2.3MM (BUR)
CANISTER SUCT 3000ML PPV (MISCELLANEOUS) ×3 IMPLANT
CLIP TI MEDIUM 6 (CLIP) IMPLANT
DECANTER SPIKE VIAL GLASS SM (MISCELLANEOUS) ×3 IMPLANT
DRAIN CHANNEL 10M FLAT 3/4 FLT (DRAIN) IMPLANT
DRAIN PENROSE 1/2X12 LTX STRL (WOUND CARE) IMPLANT
DRAPE SURG IRRIG POUCH 19X23 (DRAPES) IMPLANT
DRAPE WARM FLUID 44X44 (DRAPE) ×3 IMPLANT
DRILL WIRE PASS 1.3MM (BIT)
DRSG ADAPTIC 3X8 NADH LF (GAUZE/BANDAGES/DRESSINGS) IMPLANT
DRSG OPSITE POSTOP 4X8 (GAUZE/BANDAGES/DRESSINGS) ×3 IMPLANT
DRSG PAD ABDOMINAL 8X10 ST (GAUZE/BANDAGES/DRESSINGS) IMPLANT
DURAPREP 6ML APPLICATOR 50/CS (WOUND CARE) ×3 IMPLANT
ELECT CAUTERY BLADE 6.4 (BLADE) ×3 IMPLANT
ELECT REM PT RETURN 9FT ADLT (ELECTROSURGICAL) ×3
ELECTRODE REM PT RTRN 9FT ADLT (ELECTROSURGICAL) ×1 IMPLANT
EVACUATOR SILICONE 100CC (DRAIN) IMPLANT
GAUZE SPONGE 4X4 12PLY STRL (GAUZE/BANDAGES/DRESSINGS) IMPLANT
GAUZE SPONGE 4X4 16PLY XRAY LF (GAUZE/BANDAGES/DRESSINGS) IMPLANT
GLOVE BIO SURGEON STRL SZ7 (GLOVE) ×6 IMPLANT
GLOVE BIO SURGEON STRL SZ7.5 (GLOVE) IMPLANT
GLOVE BIOGEL PI IND STRL 7.5 (GLOVE) ×2 IMPLANT
GLOVE BIOGEL PI IND STRL 8.5 (GLOVE) ×1 IMPLANT
GLOVE BIOGEL PI INDICATOR 7.5 (GLOVE) ×4
GLOVE BIOGEL PI INDICATOR 8.5 (GLOVE) ×2
GLOVE ECLIPSE 8.5 STRL (GLOVE) ×3 IMPLANT
GLOVE EXAM NITRILE LRG STRL (GLOVE) IMPLANT
GLOVE EXAM NITRILE MD LF STRL (GLOVE) IMPLANT
GLOVE EXAM NITRILE XL STR (GLOVE) IMPLANT
GLOVE EXAM NITRILE XS STR PU (GLOVE) IMPLANT
GLOVE SS BIOGEL STRL SZ 7 (GLOVE) ×1 IMPLANT
GLOVE SUPERSENSE BIOGEL SZ 7 (GLOVE) ×2
GOWN STRL REUS W/ TWL LRG LVL3 (GOWN DISPOSABLE) ×1 IMPLANT
GOWN STRL REUS W/ TWL XL LVL3 (GOWN DISPOSABLE) ×2 IMPLANT
GOWN STRL REUS W/TWL 2XL LVL3 (GOWN DISPOSABLE) ×3 IMPLANT
GOWN STRL REUS W/TWL LRG LVL3 (GOWN DISPOSABLE) ×2
GOWN STRL REUS W/TWL XL LVL3 (GOWN DISPOSABLE) ×4
HEMOSTAT SURGICEL 2X14 (HEMOSTASIS) IMPLANT
KIT BASIN OR (CUSTOM PROCEDURE TRAY) ×3 IMPLANT
KIT ROOM TURNOVER OR (KITS) ×3 IMPLANT
NEEDLE HYPO 22GX1.5 SAFETY (NEEDLE) ×3 IMPLANT
NS IRRIG 1000ML POUR BTL (IV SOLUTION) ×3 IMPLANT
PACK CRANIOTOMY (CUSTOM PROCEDURE TRAY) ×3 IMPLANT
PATTIES SURGICAL .5 X.5 (GAUZE/BANDAGES/DRESSINGS) IMPLANT
PATTIES SURGICAL .5 X3 (DISPOSABLE) IMPLANT
PATTIES SURGICAL 1X1 (DISPOSABLE) IMPLANT
PIN MAYFIELD SKULL DISP (PIN) IMPLANT
PLATE 1.5  2HOLE LNG NEURO (Plate) ×4 IMPLANT
PLATE 1.5 2HOLE LNG NEURO (Plate) ×2 IMPLANT
PLATE 1.5/0.5 18.5MM BURR HOLE (Plate) ×3 IMPLANT
SCREW SELF DRILL HT 1.5/4MM (Screw) ×24 IMPLANT
SPECIMEN JAR SMALL (MISCELLANEOUS) IMPLANT
SPONGE NEURO XRAY DETECT 1X3 (DISPOSABLE) IMPLANT
SPONGE SURGIFOAM ABS GEL 100 (HEMOSTASIS) IMPLANT
STAPLER SKIN PROX WIDE 3.9 (STAPLE) ×3 IMPLANT
SUT ETHILON 3 0 FSL (SUTURE) IMPLANT
SUT NURALON 4 0 TR CR/8 (SUTURE) ×6 IMPLANT
SUT VIC AB 2-0 CP2 18 (SUTURE) ×6 IMPLANT
SYR CONTROL 10ML LL (SYRINGE) ×3 IMPLANT
TOWEL OR 17X24 6PK STRL BLUE (TOWEL DISPOSABLE) ×3 IMPLANT
TOWEL OR 17X26 10 PK STRL BLUE (TOWEL DISPOSABLE) ×3 IMPLANT
TRAP SPECIMEN MUCOUS 40CC (MISCELLANEOUS) IMPLANT
TRAY FOLEY W/METER SILVER 14FR (SET/KITS/TRAYS/PACK) IMPLANT
UNDERPAD 30X30 INCONTINENT (UNDERPADS AND DIAPERS) IMPLANT
WATER STERILE IRR 1000ML POUR (IV SOLUTION) ×3 IMPLANT

## 2015-05-31 NOTE — Anesthesia Preprocedure Evaluation (Addendum)
Anesthesia Evaluation  Patient identified by MRN, date of birth, ID band Patient awake    Reviewed: Allergy & Precautions, NPO status , Patient's Chart, lab work & pertinent test results  History of Anesthesia Complications (+) PONV  Airway Mallampati: I  TM Distance: >3 FB Neck ROM: Full    Dental  (+) Teeth Intact, Dental Advisory Given   Pulmonary sleep apnea ,    breath sounds clear to auscultation       Cardiovascular hypertension, Pt. on medications  Rhythm:Irregular Rate:Tachycardia     Neuro/Psych CVA, Residual Symptoms    GI/Hepatic GERD  ,  Endo/Other  diabetes, Well Controlled, Type 2, Oral Hypoglycemic AgentsOral meds at home. Insulin in hospital.  Renal/GU      Musculoskeletal  (+) Arthritis ,   Abdominal   Peds  Hematology   Anesthesia Other Findings   Reproductive/Obstetrics                           Anesthesia Physical Anesthesia Plan  ASA: III  Anesthesia Plan: General   Post-op Pain Management:    Induction: Intravenous  Airway Management Planned: Oral ETT  Additional Equipment: Arterial line  Intra-op Plan:   Post-operative Plan: Extubation in OR  Informed Consent: I have reviewed the patients History and Physical, chart, labs and discussed the procedure including the risks, benefits and alternatives for the proposed anesthesia with the patient or authorized representative who has indicated his/her understanding and acceptance.     Plan Discussed with: Anesthesiologist, Surgeon and CRNA  Anesthesia Plan Comments:        Anesthesia Quick Evaluation

## 2015-05-31 NOTE — Op Note (Signed)
Date of surgery: 05/31/2015 Preoperative diagnosis: Right occipital intracerebral hemorrhage Postoperative diagnosis: Right occipital intracerebral hemorrhage Procedure: Right occipital craniotomy and evacuation of intracerebral hemorrhage Surgeon: Barnett AbuHenry Jessa Stinson First assistant: Johnnye LanaBen Ditty M.D. Anesthesia: Gen. endotracheal Indications: Mr. Trevor KellerOtis Duncan is a 60 year old individual who is on Coumadin anticoagulation. He had sustained an intracerebral hemorrhage. Over the last 5 days he has shown some gradual deterioration and function with left-sided weakness and progressive somnolence. A recent CT scan demonstrated that he is now developing hydrocephalus. Been advised regarding the need for surgical decompression.  Procedure: The patient was brought to the operating room supine on a stretcher. After the smooth induction of general endotracheal anesthesia with placement of an arterial blood pressure monitoring line in addition to Foley catheterization the patient was placed in a 3 pin headrest and turned prone. The back of his head was prepped with alcohol and DuraPrep and draped in a sterile fashion. The previously shaved his head. A vertical incision was created in the paramedian region on the right side from the region of the inguinal towards the parietal boss. The galea was divided down to the bone. Subperiosteal dissection was performed over the skull. A parietal burr hole was then placed. A bone flap was raised in the right occipital region measuring 3 cm in diameter. Dura was inspected and was noted to be fairly tense and under pressure. A linear incision was made in the dura and immediately under some pressure hemorrhage presented itself with some damaged cortical brain tissue. This tissue and blood was suctioned away and an intracerebral hemorrhage cavity was entered. The hemorrhage which was significantly organized was removed in a piecemeal fashion. Cautery was used to control hemorrhage from the  surrounding blood vessels. As the cavity was enlarged samples of the specimen were sent for pathologic examination. Hemostasis was achieved in a piecemeal fashion along all the peel borders. The wound was then packed with some surgery cell and cotton balls. After irrigation the surgery cell cotton balls were removed and further hemostasis with the bipolar cautery was achieved. Then cavity of the hemorrhage was line. During this dissection was felt that the atrium of the ventricle on that right side was entered. With the bleeding being well controlled and hemostasis well established the dura was closed with singular 4-0 Nurolon sutures. The bone flap was then replaced with a bur hole cover and 2 dog bone plates. A subgaleal drain was then placed and brought out through separate stab incision and the galea was closed with 20 Vicryls interrupted fashion surgical staples were used in the skin. A dry sterile dressing was applied to the patient's scalp. Patient tolerated procedure well blood loss for the procedure was estimated at 150 mL. His returned to recovery room in stable condition.

## 2015-05-31 NOTE — Progress Notes (Signed)
Patient ID: [name], male   DOB: 1956-01-25, 60 y.o.   MRN: [id number] Vital signs stable Patient is noted to be more somnolent today in addition he's had some left-sided drift and left lower extremity weakness left facial weakness is also apparent Repeat CT scan demonstrates the patient is developing ventriculomegaly in addition to increased shift of 13 mm blood clot appears about the same size however edema surrounding this clot may have increased. Given his clinical deterioration advised surgical decompression of the intracerebral hemorrhage. I have discussed this with the patient. We will schedule him for surgery this evening.

## 2015-05-31 NOTE — Transfer of Care (Signed)
Immediate Anesthesia Transfer of Care Note  Patient: [name]  Procedure(s) Performed: Procedure(s): Occipital Craniotomy for Intracerebral Hematoma  (Right)  Patient Location: NICU  Anesthesia Type:General  Level of Consciousness: awake, alert  and oriented  Airway & Oxygen Therapy: Patient connected to nasal cannula oxygen  Post-op Assessment: Report given to RN and Post -op Vital signs reviewed and stable  Post vital signs: Reviewed and stable  Last Vitals:  Filed Vitals:   05/31/15 1700 05/31/15 1800  BP: 151/100 152/97  Pulse:    Temp:    Resp: 23 20    Complications: No apparent anesthesia complications

## 2015-05-31 NOTE — Anesthesia Postprocedure Evaluation (Signed)
Anesthesia Post Note  Patient: [name]  Procedure(s) Performed: Procedure(s) (LRB): Occipital Craniotomy for Intracerebral Hematoma  (Right)  Patient location during evaluation: PACU Anesthesia Type: General Level of consciousness: awake and alert and patient cooperative Pain management: pain level controlled Vital Signs Assessment: post-procedure vital signs reviewed and stable Respiratory status: spontaneous breathing and respiratory function stable Cardiovascular status: stable Anesthetic complications: no    Last Vitals:  Filed Vitals:   05/31/15 2250 05/31/15 2300  BP: 133/93 152/102  Pulse: 90 93  Temp:    Resp: 13 16    Last Pain:  Filed Vitals:   05/31/15 2308  PainSc: 7                  Malcolm Quast S

## 2015-05-31 NOTE — Progress Notes (Signed)
STROKE TEAM PROGRESS NOTE   SUBJECTIVE (INTERVAL HISTORY) Pt RN is at the bedside. He is sitting in chair, eating well. Na 143. And his 3% saline is now 100cc/h. But we will taper off 3% saline and will be off in 24 hours.    OBJECTIVE Temp:  [97.8 F (36.6 C)-99.1 F (37.3 C)] 98.9 F (37.2 C) (01/06 0745) Pulse Rate:  [41-103] 69 (01/06 0700) Cardiac Rhythm:  [-] Atrial fibrillation (01/06 0400) Resp:  [14-28] 17 (01/06 0700) BP: (117-153)/(62-116) 135/101 mmHg (01/06 0700) SpO2:  [90 %-100 %] 99 % (01/06 0700)  CBC:  Recent Labs Lab 05/27/15 1630  05/30/15 0450 05/31/15 0550  WBC 8.5  < > 9.6 9.8  NEUTROABS 5.9  --   --   --   HGB 13.3  < > 12.4* 11.4*  HCT 42.0  < > 39.0 36.5*  MCV 77.2*  < > 77.7* 78.3  PLT 372  < > 302 263  < > = values in this interval not displayed.  Basic Metabolic Panel:  Recent Labs Lab 05/30/15 0450  05/30/15 2345 05/31/15 0550  NA 147*  148*  < > 148* 143  K 3.5  --   --  3.2*  CL 114*  --   --  109  CO2 24  --   --  26  GLUCOSE 170*  --   --  134*  BUN 9  --   --  <5*  CREATININE 0.91  --   --  0.76  CALCIUM 9.1  --   --  8.7*  < > = values in this interval not displayed.  Lipid Panel:     Component Value Date/Time   CHOL 111 05/29/2015 0510   TRIG 72 05/29/2015 0510   HDL 35* 05/29/2015 0510   CHOLHDL 3.2 05/29/2015 0510   VLDL 14 05/29/2015 0510   LDLCALC 62 05/29/2015 0510   HgbA1c:  Lab Results  Component Value Date   HGBA1C 7.6* 05/29/2015   Urine Drug Screen:     Component Value Date/Time   LABOPIA POSITIVE* 05/27/2015 1944   COCAINSCRNUR NONE DETECTED 05/27/2015 1944   LABBENZ NONE DETECTED 05/27/2015 1944   AMPHETMU NONE DETECTED 05/27/2015 1944   THCU NONE DETECTED 05/27/2015 1944   LABBARB NONE DETECTED 05/27/2015 1944      IMAGING I have personally reviewed the radiological images below and agree with the radiology interpretations.  Ct Angio Head and Neck W/cm &/or Wo Cm  05/27/2015   Aborted study  due to extravasation of IV contrast into the arm. Study will perhaps be attempted at a later time.   Ct Head Wo Contrast 05/27/2015   Right parietal/occipital lobe hemorrhage, likely due to hemorrhagic transformation of a right PCA distribution infarct. Mass effect with extension of hemorrhage into the right lateral ventricle and probable developing hydrocephal  05/28/2015 IMPRESSION: 1. Size stable parenchymal hemorrhage in the posterior right cerebral hemisphere, again suggestive of hemorrhagic PCA territory infarct. Mass effect with 8mm midline shift is stable. 2. Stable intraventricular extension with right temporal horn obstruction. 3. Correlation with reported outside imaging would be useful. Otherwise, surveillance is recommended.   05/30/2015  IMPRESSION: 1. Large right parieto-occipital intraparenchymal hemorrhage has increased mildly in size, measuring 8.4 x 5.0 cm, with mildly worsened surrounding edema. The degree of leftward midline shift is relatively stable, measuring 7-8 mm. 2. Mild dilatation of the lateral ventricles in comparison to the prior study, concerning for mild obstructive hydrocephalus, with new blood noted filling  the third ventricle. 3. Mild mass effect about the right cerebral peduncle, without evidence for transtentorial herniation at this time. By my reading, the hematoma is the same as two days ago.   TTE - - Left ventricle: The cavity size was normal. Systolic function was normal. The estimated ejection fraction was in the range of 60% to 65%. Wall motion was normal; there were no regional wall motion abnormalities. The study was not technically sufficient to allow evaluation of LV diastolic dysfunction due to atrial fibrillation. - Aortic valve: There was no regurgitation. - Aortic root: The aortic root was normal in size. - Mitral valve: Structurally normal valve. There was mild regurgitation. - Left atrium: The atrium was mildly dilated. - Right  ventricle: The cavity size was normal. Wall thickness was normal. Systolic function was normal. - Right atrium: The atrium was normal in size. - Tricuspid valve: There was mild regurgitation. - Pulmonic valve: There was no regurgitation. - Pulmonary arteries: Systolic pressure was within the normal range. - Inferior vena cava: The vessel was normal in size. - Pericardium, extracardiac: There was no pericardial effusion.   PHYSICAL EXAM  Temp:  [97.8 F (36.6 C)-99.1 F (37.3 C)] 98.9 F (37.2 C) (01/06 0745) Pulse Rate:  [41-103] 69 (01/06 0700) Resp:  [14-28] 17 (01/06 0700) BP: (117-153)/(62-116) 135/101 mmHg (01/06 0700) SpO2:  [90 %-100 %] 99 % (01/06 0700)  General - Well nourished, well developed, in no acute distress.  Ophthalmologic - Fundi not visualized due to distress.  Cardiovascular - Regular rate and rhythm.  Mental Status -  Level of arousal and orientation to time, place, and person were intact. Language including expression, naming, repetition, comprehension was assessed and found intact. Attention span and concentration were normal. Fund of Knowledge was assessed and was intact.  Cranial Nerves II - XII - II - left homonymous hemianopia. III, IV, VI - Extraocular movements intact. V - Facial sensation intact bilaterally. VII - Facial movement intact bilaterally. VIII - Hearing & vestibular intact bilaterally. X - Palate elevates symmetrically. XI - Chin turning & shoulder shrug intact bilaterally. XII - Tongue protrusion intact.  Motor Strength - The patient's strength was normal in all extremities and pronator drift was absent.  Bulk was normal and fasciculations were absent.   Motor Tone - Muscle tone was assessed at the neck and appendages and was normal.  Reflexes - The patient's reflexes were 1+ in all extremities and he had no pathological reflexes.  Sensory - Light touch, temperature/pinprick were assessed and were symmetrical.     Coordination - The patient had normal movements in the hands and feet with no ataxia or dysmetria.  Tremor was absent.  Gait and Station - not tested.   ASSESSMENT/PLAN [name] is a 60 y.o. 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  He did not receive IV t-PA due to hemorrhagic stroke.  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   2D Echo - EF 60-65%  LDL 62  HgbA1c 7.6  VTE prophylaxis - SCDs Diet Carb Modified Fluid consistency:: Thin; Room service appropriate?: Yes  aspirin 325 mg daily and Eliquis (apixaban) daily prior to admission, now on No antithrombotic secondary to hemorrhage. May consider resume eliquis in about 4-6 weeks when following up in clinic depending on clinic picture.  Ongoing aggressive stroke risk factor management  Therapy recommendations: Out pt PT  Disposition: Pending  Cerebral edema  Midline shift stable on CT head  On 3% saline @ 100  Taper off today  Off in 24 hours  Recent right PCA infarct  With left hemianopia  Likely due to afib on on AC  Afib with RVR on eliquis PTA  Rate 90-115  Put back on home metoprolol and cardizem  Decrease cardizem to 120 due to bradycardia  Hold off eliquis due to ICH  Discontinue ASA as home medication  Hypertension  Stable but on the high side  Off cardene  Put back on home metoprolol and cardizem  Add lisinopril 20  Hyperlipidemia  Home meds:  No lipid  lowering medications prior to admission  LDL 62, goal < 70  Continue statin at discharge  Diabetes  HgbA1c pending, goal < 7.0  SSI  Resume home meds with metformin and glipizide  CBG monitoring  Other Stroke Risk Factors  Advanced age  ETOH use  Hx stroke/TIA  Obstructive sleep apnea, on CPAP at home  Other Active Problems  HA - fioricet PRN  Nausea - zofran PRN  Hospital day # 4  This patient is critically ill due to large right occipitoparietal ICH, recent stroke, afib RVR and at significant risk of neurological worsening, death form cerebral edema, brain herniation, recurrent stroke and heart failure. This patient's care requires constant monitoring of vital signs, hemodynamics, respiratory and cardiac monitoring, review of multiple databases, neurological assessment, discussion with family, other specialists and medical decision making of high complexity. I spent 35 minutes of neurocritical care time in the care of this patient.  Marvel PlanJindong Kenna Seward, MD PhD Stroke Neurology 05/31/2015 9:17 AM       To contact Stroke Continuity provider, please refer to [email or web address]. After hours, contact General Neurology

## 2015-05-31 NOTE — Progress Notes (Signed)
Occupational Therapy Treatment Patient Details Name: [name] MRN: [id number] DOB: 06-26-1955 Today's Date: 05/31/2015    History of present illness pt presents with probable hemorrhagic transformation of prior PCA infarct 05/14/15 - head CT showed:  Large Rt parieto-occipital intraparenchymal hemorrhage, mild dilation of lateral ventricles, mild mass effect..  pt with hx of A-fib, HTN, OSA, TKA, and DM.    OT comments  Pt more sluggish with responses. He demonstrated difficulty manipulating pill cup in Lt hand.  Required min A for sit to stand x 2, and noted to drag Lt LE when ambulating requiring min A for balance.  Drift noted Lt UE.  Pt returned to sitting and RN alerted.   Follow Up Recommendations  Outpatient OT;Supervision/Assistance - 24 hour    Equipment Recommendations  None recommended by OT    Recommendations for Other Services      Precautions / Restrictions Precautions Precautions: Fall Precaution Comments: L Hemianopsia from recent R PCA Infarct.   Restrictions Weight Bearing Restrictions: No       Mobility Bed Mobility Overal bed mobility: Needs Assistance Bed Mobility: Supine to Sit     Supine to sit: Supervision     General bed mobility comments: Increased time and use of bed rail  Transfers Overall transfer level: Needs assistance Equipment used: None Transfers: Sit to/from Stand;Stand Pivot Transfers Sit to Stand: Min assist Stand pivot transfers: Min assist       General transfer comment: Pt with difficulty moving into standing x 2 - required min A.  Min A for balance standing, dragging Lt LE.  Pt returned to sitting and alerted RN     Balance Overall balance assessment: Needs assistance Sitting-balance support: Feet supported;No upper extremity supported Sitting balance-Leahy Scale: Good     Standing balance support: During functional activity;No upper extremity supported Standing balance-Leahy Scale: Fair Standing balance comment: Min  guard for pt's safety due to min instability standing unassisted                   ADL                                         General ADL Comments: Pt noted with difficulty manipulating pill cup in Lt hand today.  Pt required min A to stand, and unsteady on feet, dragging his Lt LE - requires min A to ambulate short distance and bumping into items on Rt and Lt.  Pt returned to sitting.  Lt UE strength 4/5 - 4-/5 with drift.  RN alerted.         Vision                 Additional Comments: Pt states he doesn't see out of his Lt eye.  Did test Lt eye isolated, and pt does have vision in Rt visual field    Perception     Praxis      Cognition   Behavior During Therapy: Flat affect Overall Cognitive Status: Impaired/Different from baseline Area of Impairment: Attention   Current Attention Level: Sustained      Safety/Judgement: Decreased awareness of safety;Decreased awareness of deficits   Problem Solving: Slow processing;Decreased initiation;Difficulty sequencing;Requires verbal cues General Comments: responses slower today     Extremity/Trunk Assessment               Exercises     Shoulder Instructions  General Comments      Pertinent Vitals/ Pain       Pain Assessment: No/denies pain  Home Living                                          Prior Functioning/Environment              Frequency Min 3X/week     Progress Toward Goals  OT Goals(current goals can now be found in the care plan section)  Progress towards OT goals: Not progressing toward goals - comment (Lt sided weakness )  Acute Rehab OT Goals Patient Stated Goal: to get better ADL Goals Pt Will Perform Grooming: with supervision;standing Pt Will Perform Upper Body Bathing: with supervision;sitting;standing Pt Will Perform Lower Body Bathing: with supervision;sit to/from stand Pt Will Perform Upper Body Dressing: with  supervision;sitting;standing Pt Will Perform Lower Body Dressing: with supervision;sit to/from stand Pt Will Transfer to Toilet: with supervision;ambulating;regular height toilet Pt Will Perform Toileting - Clothing Manipulation and hygiene: with supervision;sit to/from stand Additional ADL Goal #1: Pt will locate items on Lt during ADL tasks with min cues   Plan Discharge plan remains appropriate (pending results of CT)    Co-evaluation                 End of Session     Activity Tolerance Other (comment) (increased Lt sided weakness today )   Patient Left in chair;with call bell/phone within reach   Nurse Communication Other (comment) (Lt sided weakness )        Time: [id number] OT Time Calculation (min): 14 min  Charges: OT General Charges $OT Visit: 1 Procedure OT Treatments $Therapeutic Activity: 8-22 mins  Kolbey Teichert M 05/31/2015, 2:01 PM

## 2015-05-31 NOTE — Progress Notes (Signed)
[facility] and signed by pt's spouse, and [facility].  (Fax # [phone].).  Left message for [facility], Andi HenceJoseph Campbell regarding pt's admission and [facility].    Quintella BatonJulie W. Karl Erway, RN, BSN  Trauma/Neuro ICU Case Manager [phone]

## 2015-05-31 NOTE — Progress Notes (Signed)
Patient ID: Trevor BasemanOtis C Duncan, male   DOB: 1955-10-31, 60 y.o.   MRN: [id number] Alert oriented moves left side well. Hemianopsia unchanged. Stable post op

## 2015-05-31 NOTE — Progress Notes (Signed)
Physical Therapy Treatment Patient Details Name: Trevor BasemanOtis C Tripp MRN: [id number] DOB: 1955-06-01 Today's Date: 05/31/2015    History of Present Illness pt presents with probable hemorrhagic transformation of prior PCA infarct 05/14/15 - head CT showed:  Large Rt parieto-occipital intraparenchymal hemorrhage, mild dilation of lateral ventricles, mild mass effect..  pt with hx of A-fib, HTN, OSA, TKA, and DM.     PT Comments    Mr. Suzan NailerRorie continues to require min guard assist while ambulating to attend to objects on his Lt.  Updated follow up recommendations to 24/7 supervision upon initial d/c due to pt's decreased awareness of deficits.  Pt will benefit from continued skilled PT services to increase functional independence and safety.   Follow Up Recommendations  Outpatient PT;Supervision/Assistance - 24 hour     Equipment Recommendations  None recommended by PT    Recommendations for Other Services       Precautions / Restrictions Precautions Precautions: Fall Precaution Comments: L Hemianopsia from recent R PCA Infarct.   Restrictions Weight Bearing Restrictions: No    Mobility  Bed Mobility Overal bed mobility: Needs Assistance Bed Mobility: Supine to Sit     Supine to sit: Supervision     General bed mobility comments: Increased time and use of bed rail  Transfers Overall transfer level: Needs assistance Equipment used: None Transfers: Sit to/from Stand Sit to Stand: Supervision         General transfer comment: pt with decreased attention on L side.    Ambulation/Gait Ambulation/Gait assistance: Min guard Ambulation Distance (Feet): 200 Feet Assistive device: None Gait Pattern/deviations: Step-through pattern;Decreased stride length;Drifts right/left;Shuffle   Gait velocity interpretation: Below normal speed for age/gender General Gait Details: Dec stride length w/ shuffle as he reports he is unsure of his steadiness. Pt requires cues to Lt side as he tends  to run into objects.   Stairs            Wheelchair Mobility    Modified Rankin (Stroke Patients Only) Modified Rankin (Stroke Patients Only) Pre-Morbid Rankin Score: Slight disability Modified Rankin: Moderately severe disability     Balance Overall balance assessment: Needs assistance Sitting-balance support: Feet supported;No upper extremity supported Sitting balance-Leahy Scale: Good     Standing balance support: During functional activity;No upper extremity supported Standing balance-Leahy Scale: Fair Standing balance comment: Min guard for pt's safety due to min instability standing unassisted                    Cognition Arousal/Alertness: Awake/alert Behavior During Therapy: WFL for tasks assessed/performed Overall Cognitive Status: Impaired/Different from baseline Area of Impairment: Attention;Safety/judgement;Problem solving   Current Attention Level: Selective     Safety/Judgement: Decreased awareness of safety;Decreased awareness of deficits   Problem Solving: Slow processing;Requires verbal cues General Comments: Pt looking for the nurse call bell on the side of the chair (thought he was in the bed), taking him a few seconds to realize he was in the chair.    Exercises      General Comments        Pertinent Vitals/Pain Pain Assessment: No/denies pain    Home Living                      Prior Function            PT Goals (current goals can now be found in the care plan section) Acute Rehab PT Goals Patient Stated Goal: to get better PT Goal Formulation: With patient  Time For Goal Achievement: 06/06/15 Potential to Achieve Goals: Good Progress towards PT goals: Progressing toward goals    Frequency  Min 4X/week    PT Plan Discharge plan needs to be updated    Co-evaluation             End of Session Equipment Utilized During Treatment: Gait belt Activity Tolerance: Patient tolerated treatment well Patient  left: in chair;with call bell/phone within reach     Time: 0813-0840 PT Time Calculation (min) (ACUTE ONLY): 27 min  Charges:  $Gait Training: 23-37 mins                    G Codes:      Michail Jewels PT, [place] [phone] Pager: [phone] 05/31/2015, 1:41 PM

## 2015-06-01 ENCOUNTER — Inpatient Hospital Stay (HOSPITAL_COMMUNITY): Payer: Non-veteran care

## 2015-06-01 DIAGNOSIS — I10 Essential (primary) hypertension: Secondary | ICD-10-CM | POA: Insufficient documentation

## 2015-06-01 LAB — GLUCOSE, CAPILLARY
Glucose-Capillary: 176 mg/dL — ABNORMAL HIGH (ref 65–99)
Glucose-Capillary: 118 mg/dL — ABNORMAL HIGH (ref 65–99)
Glucose-Capillary: 121 mg/dL — ABNORMAL HIGH (ref 65–99)
Glucose-Capillary: 107 mg/dL — ABNORMAL HIGH (ref 65–99)

## 2015-06-01 LAB — CBC
HCT: 34.6 % — ABNORMAL LOW (ref 39.0–52.0)
HEMOGLOBIN: 10.9 g/dL — AB (ref 13.0–17.0)
MCH: 24.6 pg — ABNORMAL LOW (ref 26.0–34.0)
MCHC: 31.5 g/dL (ref 30.0–36.0)
MCV: 78.1 fL (ref 78.0–100.0)
Platelets: 226 10*3/uL (ref 150–400)
RBC: 4.43 MIL/uL (ref 4.22–5.81)
RDW: 14.1 % (ref 11.5–15.5)
WBC: 9.8 10*3/uL (ref 4.0–10.5)

## 2015-06-01 LAB — BASIC METABOLIC PANEL
Anion gap: 10 (ref 5–15)
BUN: <5 mg/dL — ABNORMAL LOW (ref 6–20)
CHLORIDE: 105 mmol/L (ref 101–111)
CO2: 25 mmol/L (ref 22–32)
Calcium: 8.5 mg/dL — ABNORMAL LOW (ref 8.9–10.3)
Creatinine, Ser: 0.79 mg/dL (ref 0.61–1.24)
GFR calc non Af Amer: >60 mL/min (ref >60)
Glucose, Bld: 196 mg/dL — ABNORMAL HIGH (ref 65–99)
POTASSIUM: 3.8 mmol/L (ref 3.5–5.1)
SODIUM: 140 mmol/L (ref 135–145)

## 2015-06-01 LAB — SODIUM
SODIUM: 139 mmol/L (ref 135–145)
Sodium: 141 mmol/L (ref 135–145)
Sodium: 138 mmol/L (ref 135–145)

## 2015-06-01 MED ORDER — LISINOPRIL 20 MG PO TABS
20.0000 mg | ORAL_TABLET | Freq: Two times a day (BID) | ORAL | Status: DC
  Administered 2015-06-01 – 2015-06-11 (×20): 20 mg via ORAL
  Filled 2015-06-01 (×20): qty 1

## 2015-06-01 MED ORDER — HYDROCODONE-ACETAMINOPHEN 10-325 MG PO TABS
1.0000 | ORAL_TABLET | Freq: Four times a day (QID) | ORAL | Status: DC | PRN
  Administered 2015-06-01 – 2015-06-02 (×3): 2 via ORAL
  Administered 2015-06-04 – 2015-06-07 (×5): 1 via ORAL
  Administered 2015-06-08 – 2015-06-10 (×4): 2 via ORAL
  Administered 2015-06-10: 1 via ORAL
  Filled 2015-06-01 (×3): qty 2
  Filled 2015-06-01: qty 1
  Filled 2015-06-01: qty 2
  Filled 2015-06-01: qty 1
  Filled 2015-06-01 (×2): qty 2
  Filled 2015-06-01: qty 1
  Filled 2015-06-01 (×2): qty 2
  Filled 2015-06-01 (×2): qty 1

## 2015-06-01 MED ORDER — HYDROMORPHONE HCL 1 MG/ML IJ SOLN
1.0000 mg | INTRAMUSCULAR | Status: DC | PRN
  Administered 2015-06-01 (×2): 1 mg via INTRAVENOUS
  Filled 2015-06-01 (×2): qty 1

## 2015-06-01 NOTE — Progress Notes (Signed)
No acute events Status post right occipital craniotomy for intracerebral hemorrhage last night AVSS Drain 160 Awake, alert, oriented Moves all extremities well Left hemianopsia Incision clean, dry, intact CT head: Reduce mass effect, small residual hematoma Stable Continue drain In ICU for at least 1 more night

## 2015-06-01 NOTE — Progress Notes (Addendum)
STROKE TEAM PROGRESS NOTE  HISTORY AT TIME OF ADMISSION  [name] is an 60 y.o. 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 He was started at discharge from the hospital 3 days after the stroke onset, on Eliquis 5 mg twice a day. He has been compliant with the medication, took his last dose this morning at about 8 AM. He is also on aspirin 325 mg daily for the past few years and continues to take aspirin now, along with Eliquis. 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 This morning he woke up with right-sided headache, nausea and generalized fatigue but no other focal neurological symptoms. He was advised to go to the ER by his wife for further evaluation. Initial CT of the head showed a large right parieto-occipital parenchymal hemorrhage. Neurosurgery evaluated the patient in the ER and at this time no surgical intervention is planned. His blood pressure was elevated to 170s systolic has been started on nicardipine drip to maintain goal systolic blood pressure less than 130 to reduce the risk of hemorrhagic expansion. He was also started on Kcentra, after consultation with pharmacy.    SUBJECTIVE (INTERVAL HISTORY) Patient is awake and alert.  Family members at the bedside.  Patient indicate that he has right eye and right temporal pain.  In addition, he has nausea which improves with with anti-emetics.  Otherwise, no complaints for ROS.  Patient continued to require prn meds for BPs.  This AM, patient was first able to take his home meds.  OBJECTIVE Temp:  [97.6 F (36.4 C)-98.6 F (37 C)] 97.6 F (36.4 C) (01/07 0400) Pulse Rate:  [72-108] 85 (01/07 0700) Cardiac Rhythm:  [-] Atrial fibrillation (01/07 0000) Resp:  [0-26] 0 (01/07 0700) BP: (122-156)/(82-110) 146/97 mmHg (01/07 0700) SpO2:  [67 %-100 %] 98 % (01/07 0700) Arterial Line BP:  (132-194)/(74-97) 182/88 mmHg (01/07 0700)  CBC:  Recent Labs Lab 05/27/15 1630  05/31/15 0550 06/01/15 0500  WBC 8.5  < > 9.8 9.8  NEUTROABS 5.9  --   --   --   HGB 13.3  < > 11.4* 10.9*  HCT 42.0  < > 36.5* 34.6*  MCV 77.2*  < > 78.3 78.1  PLT 372  < > 263 226  < > = values in this interval not displayed.  Basic Metabolic Panel:  Recent Labs Lab 05/31/15 0550  06/01/15 0100 06/01/15 0500  NA 143  < > 141 140  K 3.2*  --   --  3.8  CL 109  --   --  105  CO2 26  --   --  25  GLUCOSE 134*  --   --  196*  BUN <5*  --   --  <5*  CREATININE 0.76  --   --  0.79  CALCIUM 8.7*  --   --  8.5*  < > = values in this interval not displayed.  Lipid Panel:     Component Value Date/Time   CHOL 111 05/29/2015 0510   TRIG 72 05/29/2015 0510   HDL 35* 05/29/2015 0510   CHOLHDL 3.2 05/29/2015 0510   VLDL 14 05/29/2015 0510   LDLCALC 62 05/29/2015 0510   HgbA1c:  Lab Results  Component Value Date   HGBA1C 7.6* 05/29/2015   Urine Drug Screen:     Component Value Date/Time   LABOPIA POSITIVE* 05/27/2015  1944   COCAINSCRNUR NONE DETECTED 05/27/2015 1944   LABBENZ NONE DETECTED 05/27/2015 1944   AMPHETMU NONE DETECTED 05/27/2015 1944   THCU NONE DETECTED 05/27/2015 1944   LABBARB NONE DETECTED 05/27/2015 1944      IMAGING I have personally reviewed the radiological images below and agree with the radiology interpretations.  Ct Angio Head and Neck W/cm &/or Wo Cm  05/27/2015   Aborted study due to extravasation of IV contrast into the arm. Study will perhaps be attempted at a later time.   Ct Head Wo Contrast 05/27/2015   Right parietal/occipital lobe hemorrhage, likely due to hemorrhagic transformation of a right PCA distribution infarct. Mass effect with extension of hemorrhage into the right lateral ventricle and probable developing hydrocephal  05/28/2015 IMPRESSION: 1. Size stable parenchymal hemorrhage in the posterior right cerebral hemisphere, again suggestive of  hemorrhagic PCA territory infarct. Mass effect with 8mm midline shift is stable. 2. Stable intraventricular extension with right temporal horn obstruction. 3. Correlation with reported outside imaging would be useful. Otherwise, surveillance is recommended.   05/30/2015  IMPRESSION: 1. Large right parieto-occipital intraparenchymal hemorrhage has increased mildly in size, measuring 8.4 x 5.0 cm, with mildly worsened surrounding edema. The degree of leftward midline shift is relatively stable, measuring 7-8 mm. 2. Mild dilatation of the lateral ventricles in comparison to the prior study, concerning for mild obstructive hydrocephalus, with new blood noted filling the third ventricle. 3. Mild mass effect about the right cerebral peduncle, without evidence for transtentorial herniation at this time.   TTE - - Left ventricle: The cavity size was normal. Systolic function was normal. The estimated ejection fraction was in the range of 60% to 65%. Wall motion was normal; there were no regional wall motion abnormalities. The study was not technically sufficient to allow evaluation of LV diastolic dysfunction due to atrial fibrillation. - Aortic valve: There was no regurgitation. - Aortic root: The aortic root was normal in size. - Mitral valve: Structurally normal valve. There was mild regurgitation. - Left atrium: The atrium was mildly dilated. - Right ventricle: The cavity size was normal. Wall thickness was normal. Systolic function was normal. - Right atrium: The atrium was normal in size. - Tricuspid valve: There was mild regurgitation. - Pulmonic valve: There was no regurgitation. - Pulmonary arteries: Systolic pressure was within the normal range. - Inferior vena cava: The vessel was normal in size. - Pericardium, extracardiac: There was no pericardial effusion.   PHYSICAL EXAM  Temp:  [97.6 F (36.4 C)-98.6 F (37 C)] 97.6 F (36.4 C) (01/07 0400) Pulse Rate:  [72-108]  85 (01/07 0700) Resp:  [0-26] 0 (01/07 0700) BP: (122-156)/(82-110) 146/97 mmHg (01/07 0700) SpO2:  [67 %-100 %] 98 % (01/07 0700) Arterial Line BP: (132-194)/(74-97) 182/88 mmHg (01/07 0700)  General - Well nourished, well developed, in no acute distress. HEENT:  Drain in place; moist mucous membranes; nose and throat clear Cardiovascular - Regular rate and rhythm. Abd:  Benign Extr:  No C/C/E  Mental Status -  Level of arousal and orientation to time, place, and person were intact. Language including expression, naming, repetition, comprehension was assessed and found intact. Attention span and concentration were normal. Fund of Knowledge was assessed and was intact.  Cranial Nerves II - XII - II - left homonymous hemianopia. III, IV, VI - Extraocular movements intact. V - Facial sensation intact bilaterally. VII - Facial movement intact bilaterally. VIII - Hearing intact bilaterally. X - Palate elevates symmetrically. XI - Chin turning &  shoulder shrug intact bilaterally. XII - Tongue protrusion intact.  Motor Strength - The patient's strength was normal in all extremities and pronator drift was absent.  Bulk was normal and fasciculations were absent.   Motor Tone - Muscle tone was assessed at the neck and appendages and was normal.  Sensory - Light touch, temperature/pinprick were assessed and were symmetrical.    Coordination - The patient had normal movements in the hands with no ataxia or dysmetria.    Gait and Station - not tested.   ASSESSMENT/PLAN [name] is a 60 y.o. 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  He did not receive IV t-PA due to hemorrhagic stroke.  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 May consider resume eliquis in about 4-6 weeks when following up in clinic depending on clinic picture.  Ongoing aggressive stroke risk factor management  Therapy recommendations: Out pt PT  Disposition: Pending  Cerebral edema  Midline shift stable on CT head  Now off 3% saline  Na 140 on 06/01/2015  Recent right PCA infarct  With left hemianopia  Likely due to afib on on AC  Afib with RVR on eliquis PTA  Rate 90-115  Put back on home metoprolol and cardizem  Decrease cardizem to 120 due to bradycardia  Hold off eliquis due to ICH  Discontinue ASA as home medication  Hypertension  Malignant hypertension  Off cardene  Put back on home medications.  Now on:  Metoprolol 75mg  BID  Diltiazem XR 120mg  daily  Lisinopril 20mg  daily.  Will change to BID  Hyperlipidemia  Home meds:  No lipid lowering medications prior to admission  LDL 62, goal < 70  Continue statin at discharge  Diabetes  HgbA1c 7.6, goal < 7.0  SSI  Resume home meds with metformin and glipizide  CBG monitoring  Other Stroke Risk Factors  Advanced age  ETOH use  Hx stroke/TIA  Obstructive sleep apnea, on CPAP at home  Other Active Problems  HA - fioricet PRN  Nausea - zofran PRN  Anemia  Hospital day # 5  CRITICAL CARE NEUROLOGY ATTENDING NOTE Patient was seen and examined by me personally. I reviewed notes, independently viewed imaging studies, participated in medical decision making and plan of care. The  laboratory and radiographic studies were personally reviewed by me.  ROS completed by me personally and pertinent positives fully documented.  Assessment and plan completed by me personally and fully documented.    Condition is improved and critical  This patient is critically ill and at significant risk of neurological worsening, death and care requires constant monitoring of vital signs, hemodynamics,respiratory and cardiac monitoring, extensive review of multiple databases, frequent neurological assessment, discussion with family, other specialists and medical decision making of high complexity.  This critical care time does not reflect procedure time, or teaching time or supervisory time of PA/NP/Med Resident etc. but could involve care discussion time.  I spent 30 minutes of Neurocritical Care time in the care of  this patient.  SIGNED BY: Dr. Sula Soda

## 2015-06-02 DIAGNOSIS — R001 Bradycardia, unspecified: Secondary | ICD-10-CM

## 2015-06-02 DIAGNOSIS — R112 Nausea with vomiting, unspecified: Secondary | ICD-10-CM

## 2015-06-02 LAB — CBC
HCT: 34.1 % — ABNORMAL LOW (ref 39.0–52.0)
Hemoglobin: 10.8 g/dL — ABNORMAL LOW (ref 13.0–17.0)
MCH: 24.9 pg — ABNORMAL LOW (ref 26.0–34.0)
MCHC: 31.7 g/dL (ref 30.0–36.0)
MCV: 78.6 fL (ref 78.0–100.0)
PLATELETS: 203 10*3/uL (ref 150–400)
RBC: 4.34 MIL/uL (ref 4.22–5.81)
RDW: 14.4 % (ref 11.5–15.5)
WBC: 8.6 10*3/uL (ref 4.0–10.5)

## 2015-06-02 LAB — GLUCOSE, CAPILLARY
GLUCOSE-CAPILLARY: 135 mg/dL — AB (ref 65–99)
GLUCOSE-CAPILLARY: 102 mg/dL — AB (ref 65–99)
Glucose-Capillary: 89 mg/dL (ref 65–99)
Glucose-Capillary: 72 mg/dL (ref 65–99)

## 2015-06-02 LAB — SODIUM
SODIUM: 137 mmol/L (ref 135–145)
SODIUM: 138 mmol/L (ref 135–145)
SODIUM: 138 mmol/L (ref 135–145)

## 2015-06-02 NOTE — Progress Notes (Signed)
No acute events AVSS Stable left hemianopsia Good strength bilaterally, subtle left pronator drift Incision clean, dry, intact Neurologically stable I removed his drain today Okay to move out of the ICU at discretion of neurology team

## 2015-06-02 NOTE — Progress Notes (Signed)
STROKE TEAM PROGRESS NOTE  HISTORY DOCUMENTED AT TIME OF ADMISSION  [name] is an 60 y.o. 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 He was started at discharge from the hospital 3 days after the stroke onset, on Eliquis 5 mg twice a day. He has been compliant with the medication, took his last dose this morning at about 8 AM. He is also on aspirin 325 mg daily for the past few years and continues to take aspirin now, along with Eliquis. 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 This morning he woke up with right-sided headache, nausea and generalized fatigue but no other focal neurological symptoms. He was advised to go to the ER by his wife for further evaluation. Initial CT of the head showed a large right parieto-occipital parenchymal hemorrhage. Neurosurgery evaluated the patient in the ER and at this time no surgical intervention is planned. His blood pressure was elevated to 170s systolic has been started on nicardipine drip to maintain goal systolic blood pressure less than 130 to reduce the risk of hemorrhagic expansion. He was also started on Kcentra, after consultation with pharmacy.    SUBJECTIVE (INTERVAL HISTORY) Patient is awake and alert.  Yesterday afternoon, patient experienced increased headache.  An adjustment in pain medication dosing helped with the discomfort.  With assistance, patient was able to get OOB to chair yesterday.  Apparently, when exiting the bed and standing to return to bed, patient experienced some dizziness.  For ROS this morning, patient is without complaint  OBJECTIVE Temp:  [97.9 F (36.6 C)-98.9 F (37.2 C)] 98.2 F (36.8 C) (01/08 0400) Pulse Rate:  [72-108] 102 (01/08 0600) Cardiac Rhythm:  [-] Atrial fibrillation;Other (Comment) (01/07 0800) Resp:  [0-18] 18 (01/08 0700) BP: (115-172)/(57-109)  145/88 mmHg (01/08 0700) SpO2:  [94 %-100 %] 99 % (01/08 0600) Arterial Line BP: (129-171)/(85-109) 129/109 mmHg (01/07 0830)  CBC:  Recent Labs Lab 05/27/15 1630  06/01/15 0500 06/02/15 0600  WBC 8.5  < > 9.8 8.6  NEUTROABS 5.9  --   --   --   HGB 13.3  < > 10.9* 10.8*  HCT 42.0  < > 34.6* 34.1*  MCV 77.2*  < > 78.1 78.6  PLT 372  < > 226 203  < > = values in this interval not displayed.  Basic Metabolic Panel:  Recent Labs Lab 05/31/15 0550  06/01/15 0500  06/02/15 06/02/15 0600  NA 143  < > 140  < > 138 137  K 3.2*  --  3.8  --   --   --   CL 109  --  105  --   --   --   CO2 26  --  25  --   --   --   GLUCOSE 134*  --  196*  --   --   --   BUN <5*  --  <5*  --   --   --   CREATININE 0.76  --  0.79  --   --   --   CALCIUM 8.7*  --  8.5*  --   --   --   < > = values in this interval not displayed.  Lipid Panel:     Component Value Date/Time   CHOL 111 05/29/2015 0510   TRIG 72 05/29/2015 0510   HDL 35* 05/29/2015 0510  CHOLHDL 3.2 05/29/2015 0510   VLDL 14 05/29/2015 0510   LDLCALC 62 05/29/2015 0510   HgbA1c:  Lab Results  Component Value Date   HGBA1C 7.6* 05/29/2015   Urine Drug Screen:     Component Value Date/Time   LABOPIA POSITIVE* 05/27/2015 1944   COCAINSCRNUR NONE DETECTED 05/27/2015 1944   LABBENZ NONE DETECTED 05/27/2015 1944   AMPHETMU NONE DETECTED 05/27/2015 1944   THCU NONE DETECTED 05/27/2015 1944   LABBARB NONE DETECTED 05/27/2015 1944      IMAGING   Ct Angio Head and Neck W/cm &/or Wo Cm  05/27/2015   Aborted study due to extravasation of IV contrast into the arm.   Ct Head Wo Contrast 05/27/2015   Right parietal/occipital lobe hemorrhage, likely due to hemorrhagic transformation of a right PCA distribution infarct. Mass effect with extension of hemorrhage into the right lateral ventricle and probable developing hydrocephal  05/28/2015 IMPRESSION: 1. Size stable parenchymal hemorrhage in the posterior right cerebral hemisphere,  again suggestive of hemorrhagic PCA territory infarct. Mass effect with 8mm midline shift is stable. 2. Stable intraventricular extension with right temporal horn obstruction. 3. Correlation with reported outside imaging would be useful. Otherwise, surveillance is recommended.   05/30/2015  IMPRESSION: 1. Large right parieto-occipital intraparenchymal hemorrhage has increased mildly in size, measuring 8.4 x 5.0 cm, with mildly worsened surrounding edema. The degree of leftward midline shift is relatively stable, measuring 7-8 mm. 2. Mild dilatation of the lateral ventricles in comparison to the prior study, concerning for mild obstructive hydrocephalus, with new blood noted filling the third ventricle. 3. Mild mass effect about the right cerebral peduncle, without evidence for transtentorial herniation at this time.     CT Head Wo Contrast 05/31/2015 Hemorrhagic right PCA infarct with intraventricular extension. The volume of hemorrhage and vasogenic edema is stable from yesterday but there is progressive lateral ventriculomegaly with increased leftward midline shift, now 13 mm.   CT Head Wo Contrast 06/01/2015 1. Hemorrhagic right PCA infarct status post interval hematoma evacuation. Residual hemorrhage as above. 2. Decreased midline shift, now 6 mm. 3. Decreased size of the ventricles.   TTE - - Left ventricle: The cavity size was normal. Systolic function was normal. The estimated ejection fraction was in the range of 60% to 65%. Wall motion was normal; there were no regional wall motion abnormalities. The study was not technically sufficient to allow evaluation of LV diastolic dysfunction due to atrial fibrillation. - Aortic valve: There was no regurgitation. - Aortic root: The aortic root was normal in size. - Mitral valve: Structurally normal valve. There was mild regurgitation. - Left atrium: The atrium was mildly dilated. - Right ventricle: The cavity size was normal. Wall  thickness was normal. Systolic function was normal. - Right atrium: The atrium was normal in size. - Tricuspid valve: There was mild regurgitation. - Pulmonic valve: There was no regurgitation. - Pulmonary arteries: Systolic pressure was within the normal range. - Inferior vena cava: The vessel was normal in size. - Pericardium, extracardiac: There was no pericardial effusion.     PHYSICAL EXAM  Temp:  [97.9 F (36.6 C)-98.9 F (37.2 C)] 98.2 F (36.8 C) (01/08 0400) Pulse Rate:  [72-108] 102 (01/08 0600) Resp:  [0-18] 18 (01/08 0700) BP: (115-172)/(57-109) 145/88 mmHg (01/08 0700) SpO2:  [94 %-100 %] 99 % (01/08 0600) Arterial Line BP: (129-171)/(85-109) 129/109 mmHg (01/07 0830)  General - Well nourished, well developed, in no acute distress. HEENT:  Drain in place; moist mucous membranes; nose  and throat clear Cardiovascular - Regular rate and rhythm. Abd:  Benign Extr:  No C/C/E  Mental Status -  Level of arousal and orientation to time, place, and person were intact. Language including expression, naming, repetition, comprehension was assessed and found intact. Attention span and concentration were normal. Fund of Knowledge was assessed and was intact.  Cranial Nerves II - XII - II - left homonymous hemianopia. III, IV, VI - Extraocular movements intact. V - Facial sensation intact bilaterally. VII - Facial movement intact bilaterally. VIII - Hearing intact bilaterally. X - Palate elevates symmetrically. XI - Chin turning & shoulder shrug intact bilaterally. XII - Tongue protrusion intact.  Motor Strength - The patient's strength was normal in all extremities and pronator drift was absent.  Bulk was normal and fasciculations were absent.   Motor Tone - Muscle tone was assessed at the neck and appendages and was normal.  Sensory - Light touch, temperature/pinprick were assessed and were symmetrical.    Coordination - The patient had normal movements in the  hands with no ataxia or dysmetria.    Gait and Station - not tested.   ASSESSMENT/PLAN Mr. ADIL TUGWELL is a 60 y.o. 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  He did not receive IV t-PA due to hemorrhagic stroke.  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 May consider resume eliquis in about 4-6 weeks when following up in clinic depending on clinic picture.  Ongoing aggressive stroke risk factor management  Therapy recommendations: Out pt PT  Disposition: Pending  Cerebral edema  Midline shift stable on CT head  Now off 3% saline  Na 140 on 06/01/2015  Recent right PCA infarct  With left hemianopia  Likely due to afib on on AC  Afib with RVR on eliquis PTA  Rate 90-115  Put back on home metoprolol and cardizem  Decrease cardizem to 120 due to bradycardia  Hold off eliquis due to ICH  Discontinue ASA as home medication  Hypertension  Malignant hypertension  Off cardene  Put back on home medications.  Now on:  Metoprolol 75mg  BID  Diltiazem XR 120mg  daily  Lisinopril 20mg  daily.  Will change to BID  Hyperlipidemia  Home meds:  No lipid lowering medications prior to  admission  LDL 62, goal < 70  Continue statin at discharge  Diabetes  HgbA1c 7.6, goal < 7.0  SSI  Resume home meds with metformin and glipizide  CBG monitoring  Other Stroke Risk Factors  Advanced age  ETOH use  Hx stroke/TIA  Obstructive sleep apnea, on CPAP at home  Other Active Problems  HA - fioricet PRN  Nausea - zofran PRN  Anemia  Hospital day # 6  CRITICAL CARE NEUROLOGY ATTENDING  NOTE Patient was seen and examined by me personally. I reviewed notes, independently viewed imaging studies, participated in medical decision making and plan of care. The laboratory and radiographic studies were personally reviewed by me.  ROS completed by me personally and pertinent positives fully documented.  Assessment and plan completed by me personally:  Continue current pain regimen.  Hopefully patient will tolerate weaning regimen over next few days  Check orthostatic BPs    Schedule anti-emetic before meals  Condition is improved and critical  This patient is critically ill and at significant risk of neurological worsening, death and care requires constant monitoring of vital signs, hemodynamics,respiratory and cardiac monitoring, extensive review of multiple databases, frequent neurological assessment, discussion with family, other specialists and medical decision making of high complexity.  This critical care time does not reflect procedure time, or teaching time or supervisory time of PA/NP/Med Resident etc. but could involve care discussion time.  I spent 30 minutes of Neurocritical Care time in the care of  this patient.  SIGNED BY: Dr. Sula Sodahere Nina Hoar

## 2015-06-03 ENCOUNTER — Encounter (HOSPITAL_COMMUNITY): Payer: Self-pay | Admitting: Neurological Surgery

## 2015-06-03 ENCOUNTER — Inpatient Hospital Stay (HOSPITAL_COMMUNITY): Payer: Non-veteran care

## 2015-06-03 LAB — BASIC METABOLIC PANEL
Anion gap: 12 (ref 5–15)
BUN: 5 mg/dL — AB (ref 6–20)
CHLORIDE: 101 mmol/L (ref 101–111)
CO2: 26 mmol/L (ref 22–32)
CREATININE: 0.84 mg/dL (ref 0.61–1.24)
Calcium: 8.9 mg/dL (ref 8.9–10.3)
GFR calc Af Amer: >60 mL/min (ref >60)
GFR calc non Af Amer: >60 mL/min (ref >60)
Glucose, Bld: 159 mg/dL — ABNORMAL HIGH (ref 65–99)
Potassium: 3.5 mmol/L (ref 3.5–5.1)
Sodium: 139 mmol/L (ref 135–145)

## 2015-06-03 LAB — CBC
HEMATOCRIT: 32.4 % — AB (ref 39.0–52.0)
Hemoglobin: 10.3 g/dL — ABNORMAL LOW (ref 13.0–17.0)
MCH: 24.8 pg — ABNORMAL LOW (ref 26.0–34.0)
MCHC: 31.8 g/dL (ref 30.0–36.0)
MCV: 78.1 fL (ref 78.0–100.0)
Platelets: 199 10*3/uL (ref 150–400)
RBC: 4.15 MIL/uL — AB (ref 4.22–5.81)
RDW: 14.3 % (ref 11.5–15.5)
WBC: 7.9 10*3/uL (ref 4.0–10.5)

## 2015-06-03 LAB — GLUCOSE, CAPILLARY
GLUCOSE-CAPILLARY: 151 mg/dL — AB (ref 65–99)
GLUCOSE-CAPILLARY: 165 mg/dL — AB (ref 65–99)
Glucose-Capillary: 104 mg/dL — ABNORMAL HIGH (ref 65–99)
Glucose-Capillary: 84 mg/dL (ref 65–99)

## 2015-06-03 MED ORDER — ACETAMINOPHEN-CODEINE #3 300-30 MG PO TABS
1.0000 | ORAL_TABLET | Freq: Four times a day (QID) | ORAL | Status: DC | PRN
  Administered 2015-06-04: 1 via ORAL
  Filled 2015-06-03: qty 1

## 2015-06-03 MED ORDER — INDOMETHACIN 50 MG PO CAPS
50.0000 mg | ORAL_CAPSULE | Freq: Two times a day (BID) | ORAL | Status: DC
  Administered 2015-06-03 – 2015-06-11 (×16): 50 mg via ORAL
  Filled 2015-06-03 (×21): qty 1

## 2015-06-03 MED ORDER — LEVETIRACETAM 500 MG PO TABS
500.0000 mg | ORAL_TABLET | Freq: Two times a day (BID) | ORAL | Status: DC
  Administered 2015-06-03 – 2015-06-11 (×16): 500 mg via ORAL
  Filled 2015-06-03 (×16): qty 1

## 2015-06-03 MED ORDER — GLUCERNA SHAKE PO LIQD
237.0000 mL | Freq: Three times a day (TID) | ORAL | Status: DC
  Administered 2015-06-03 – 2015-06-11 (×11): 237 mL via ORAL
  Filled 2015-06-03 (×27): qty 237

## 2015-06-03 NOTE — Progress Notes (Signed)
Patient ID: [name], male   DOB: 10/17/1955, 60 y.o.   MRN: [id number] Vital signs are stable CT scan shows some improvement in intracerebral hemorrhage Examination shows persistent hemiparesis Will need comprehensive inpatient rehabilitation Incision is clean and dry

## 2015-06-03 NOTE — Progress Notes (Addendum)
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  Pt underwent Rt occipital craniotomy for intracerebral hemorrhage on 05/31/15.  Pt with hx of A-fib, HTN, OSA, TKA, and DM.    OT comments  Pt demonstrates increased deficits since OT evaluation. Difficult to mobilize to to HR increasing to 157 with stand pivot transfers.Pt now requires mod A for mobility and demonstrates significant visual perceptual deficits in addition to L homonymous hemianopsia. More difficulty with functional use of L hand. Greater difficulty with sitting balance and midline postural control - pt with posterior and L Lat lean in unsupported sitting. Recommend CIR for rehab. Will follow acutely to facilitate safe D/C to next venue of care.   Follow Up Recommendations  CIR;Supervision/Assistance - 24 hour    Equipment Recommendations  3 in 1 bedside comode    Recommendations for Other Services Rehab consult    Precautions / Restrictions Precautions Precautions: Fall Precaution Comments: L Hemianopsia from recent R PCA Infarct; appears to have Lt neglect as well Restrictions Weight Bearing Restrictions: No       Mobility Bed Mobility Overal bed mobility: Needs Assistance Bed Mobility: Supine to Sit     Supine to sit: Mod assist     General bed mobility comments: Pt unable to get BLE off bed toward L side. Difficulty with problem solving. Became twisted up in covers and requried physical assist to assist pt to edge of bed. Unable to shift weight forward toward bedrails to move to edge of bed  Transfers Overall transfer level: Needs assistance Equipment used: 1 person hand held assist;2 person hand  held assist Transfers: Sit to/from Stand Sit to Stand: Mod assist;+2 safety/equipment Stand pivot transfers: Mod assist;+2 safety/equipment       General transfer comment: pt with decreased attention on L side and requires cues to move Lt LE during pivot.  Assist to boost up from chair and to steady during pivot w/ flexed posture    Balance Overall balance assessment: Needs assistance Sitting-balance support: Feet supported;Bilateral upper extremity supported Sitting balance-Leahy Scale: Poor (posterior and LLat lean)     Standing balance support: During functional activity Standing balance-Leahy Scale: Poor Standing balance comment: Requires assist for safe transfer                   ADL Overall ADL's : Needs assistance/impaired Eating/Feeding: Supervision/ safety;Set up Eating/Feeding Details (indicate cue type and reason): eating food on R half of platte. Only cut up food on R half of plate Grooming: Minimal assistance   Upper Body Bathing: Minimal assitance   Lower Body Bathing: Moderate assistance       Lower Body Dressing: Moderate assistance;Sit to/from stand               Functional mobility during ADLs: Moderate assistance General ADL Comments: Pt with functional decline since surgery. Pt requires min A to maintain midline postural control sitting EOB to complete ADL . Mod A for LB Dressing. Losing balance when lifting 1 leg in sitting to donn socks.      Vision                 Additional Comments: L field cut; apparent homonymous hemianosia; Apparent visual perceptual deficits; L inattention/neglect noted  Perception  will further assess. Apparent L neglect. Only ate R side of salad on plate. Only cut up salad on R side of plate. Need to further assess.   Praxis      Cognition   Behavior During Therapy: Flat affect Overall Cognitive Status: Impaired/Different from baseline Area of Impairment: Attention;Safety/judgement;Awareness;Problem  solving   Current Attention Level: Sustained Memory: Decreased short-term memory    Safety/Judgement: Decreased awareness of safety;Decreased awareness of deficits Awareness: Emergent Problem Solving: Slow processing General Comments: When cutting up salad on tray pt only cuts Rt half of plate.  Pt seems unaware of his deficits and requires cues to move Lt LE while pivoting.  Poor insight into deficits    Extremity/Trunk Assessment   appears to demonstrate more difficulty with use of LUE. Misjudging items. Cues to use L hand to assist with cutting food            Exercises     Shoulder Instructions       General Comments      Pertinent Vitals/ Pain       Pain Assessment: Faces Faces Pain Scale: Hurts little more Pain Location: L ankle Pain Descriptors / Indicators: Grimacing;Discomfort Pain Intervention(s): Limited activity within patient's tolerance  HR 157 at times  Home Living                                          Prior Functioning/Environment              Frequency Min 3X/week     Progress Toward Goals  OT Goals(current goals can now be found in the care plan section)  Progress towards OT goals: Progressing toward goals  Acute Rehab OT Goals Patient Stated Goal: to get better OT Goal Formulation: With patient Time For Goal Achievement: 06/13/15 Potential to Achieve Goals: Good ADL Goals Pt Will Perform Grooming: with supervision;standing Pt Will Perform Upper Body Bathing: with supervision;sitting;standing Pt Will Perform Lower Body Bathing: with supervision;sit to/from stand Pt Will Perform Upper Body Dressing: with supervision;sitting;standing Pt Will Perform Lower Body Dressing: with supervision;sit to/from stand Pt Will Transfer to Toilet: with supervision;ambulating;regular height toilet Pt Will Perform Toileting - Clothing Manipulation and hygiene: with supervision;sit to/from stand Additional ADL Goal #1: Pt will locate  items on Lt during ADL tasks with min cues   Plan Discharge plan needs to be updated    Co-evaluation    PT/OT/SLP Co-Evaluation/Treatment: Yes (partial session) Reason for Co-Treatment: Complexity of the patient's impairments (multi-system involvement);For patient/therapist safety PT goals addressed during session: Mobility/safety with mobility;Balance;Proper use of DME OT goals addressed during session: ADL's and self-care      End of Session Equipment Utilized During Treatment: Gait belt   Activity Tolerance Patient tolerated treatment well;Other (comment) (HR 157 at times. limited mobility)   Patient Left in chair;with call bell/phone within reach;with nursing/sitter in room   Nurse Communication Mobility status;Other (comment) (need for CIR)        Time: [id number] OT Time Calculation (min): 32 min  Charges: OT General Charges $OT Visit: 1 Procedure OT Treatments $Self Care/Home Management : 8-22 mins  Avneet Ashmore,HILLARY 06/03/2015, 4:59 PM   [facility] Sharlett Lienemann, OTR/L  [phone] 06/03/2015

## 2015-06-03 NOTE — Progress Notes (Signed)
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  Pt underwent Rt occipital craniotomy for intracerebral hemorrhage on 05/31/15.  Pt with hx of A-fib, HTN, OSA, TKA, and DM.     PT Comments    Mr. Blackwell presents w/ a decline in mobility and cognitive status.  Additionally pt c/o Lt ankle pain that is tender to palpation over calcaneofibular ligament as well as swelling present, RN notified.  Pt at supervision level of ambulation on 05/31/15 and today required mod assist for safe sit<>stand and stand pivot transfers.  Therefore, follow up recommendations have been updated to CIR.     Follow Up Recommendations  CIR     Equipment Recommendations  Other (comment) (TBD at next venue of care)    Recommendations for Other Services Rehab consult     Precautions / Restrictions Precautions Precautions: Fall Precaution Comments: L Hemianopsia from recent R PCA Infarct; appears to have Lt neglect as well Restrictions Weight Bearing Restrictions: No    Mobility  Bed Mobility               General bed mobility comments: Pt standing at bedside w/ OT upon PT arrival  Transfers Overall transfer level: Needs assistance Equipment used: 1 person hand held assist;2 person hand held assist Transfers: Sit to/from [employer] Sit to Stand: Mod assist;+2 safety/equipment Stand pivot transfers: Mod assist;+2 safety/equipment       General transfer comment: pt with decreased attention on L side and requires cues to move Lt LE during pivot.  Assist to boost up from chair and to steady during pivot w/ flexed posture  Ambulation/Gait             General Gait Details:  not attempted for pt's safety; HR up to 167 w/ stand pivot to [facility]   Stairs            Wheelchair Mobility    Modified Rankin (Stroke Patients Only) Modified Rankin (Stroke Patients Only) Pre-Morbid Rankin Score: Slight disability Modified Rankin: Moderately severe disability     Balance Overall balance assessment: Needs assistance Sitting-balance support: Feet supported;Bilateral upper extremity supported Sitting balance-Leahy Scale: Good     Standing balance support: During functional activity;Single extremity supported Standing balance-Leahy Scale: Poor Standing balance comment: Requires assist for safe transfer                    Cognition Arousal/Alertness: Awake/alert Behavior During Therapy: Flat affect Overall Cognitive Status: Impaired/Different from baseline Area of Impairment: Attention;Safety/judgement;Problem solving;Awareness;Memory   Current Attention Level: Sustained Memory: Decreased short-term memory   Safety/Judgement: Decreased awareness of safety;Decreased awareness of deficits Awareness: Intellectual Problem Solving: Slow processing;Requires verbal cues;Decreased initiation;Difficulty sequencing General Comments: When cutting up salad on tray pt only cuts Rt half of plate.  Pt seems unaware of his deficits and requires cues to move Lt LE while pivoting.    Exercises      General Comments General comments (skin integrity, edema, etc.): Pt c/o new Lt ankle pain, is warm to the touch, tender to palpation over calcaneofibular ligament (possible ankle sprain?) although pt has been on bed rest over the weekend.  RN aware and has ordered xray. Pt presents today w/ change in mobility status from supervision  ambulating in hallway on 05/31/15 to requiring mod assit for safe stand pivot today 06/03/15.  RN aware of new changes.  D/C recommendations have been updated accordingly.      Pertinent Vitals/Pain Pain Assessment: Faces Faces Pain Scale: Hurts  even more Pain Location: Lt ankle when WB or when palpated Pain Descriptors / Indicators: Discomfort;Grimacing;Tender Pain Intervention(s): Limited activity within patient's tolerance;Monitored during session;Repositioned    Home Living                      Prior Function            PT Goals (current goals can now be found in the care plan section) Acute Rehab PT Goals Patient Stated Goal: none stated PT Goal Formulation: With patient Time For Goal Achievement: 06/06/15 Potential to Achieve Goals: Good Progress towards PT goals: Not progressing toward goals - comment (generalized decline in mobility and cognition)    Frequency  Min 4X/week    PT Plan Discharge plan needs to be updated    Co-evaluation PT/OT/SLP Co-Evaluation/Treatment: Yes (initially, then PT returned to assist [facility]) Reason for Co-Treatment: Necessary to address cognition/behavior during functional activity;For patient/therapist safety PT goals addressed during session: Mobility/safety with mobility;Balance;Proper use of DME       End of Session Equipment Utilized During Treatment: Gait belt Activity Tolerance: Patient limited by fatigue;Patient limited by pain;Treatment limited secondary to medical complications (Comment) (HR up to 167 [facility]/ [facility]) Patient left: in chair;with call bell/phone within reach     Time: [id number] PT Time Calculation (min) (ACUTE ONLY): 14 min  Charges:  $Therapeutic Activity: 8-22 mins                    G Codes:      Michail JewelsAshley Parr PT, DPT [phone] Pager: [phone] 06/03/2015, 4:50 PM

## 2015-06-03 NOTE — Progress Notes (Signed)
STROKE TEAM PROGRESS NOTE  HISTORY DOCUMENTED AT TIME OF ADMISSION  [name] is an 60 y.o. 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 He was started at discharge from the hospital 3 days after the stroke onset, on Eliquis 5 mg twice a day. He has been compliant with the medication, took his last dose this morning at about 8 AM. He is also on aspirin 325 mg daily for the past few years and continues to take aspirin now, along with Eliquis. 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 This morning he woke up with right-sided headache, nausea and generalized fatigue but no other focal neurological symptoms. He was advised to go to the ER by his wife for further evaluation. Initial CT of the head showed a large right parieto-occipital parenchymal hemorrhage. Neurosurgery evaluated the patient in the ER and at this time no surgical intervention is planned. His blood pressure was elevated to 170s systolic has been started on nicardipine drip to maintain goal systolic blood pressure less than 130 to reduce the risk of hemorrhagic expansion. He was also started on Kcentra, after consultation with pharmacy.    SUBJECTIVE (INTERVAL HISTORY) Patient is awake and alert.    OOB in chair today.   No complaints. OBJECTIVE Temp:  [97.9 F (36.6 C)-99 F (37.2 C)] 98.3 F (36.8 C) (01/09 1200) Pulse Rate:  [81-114] 113 (01/09 0731) Cardiac Rhythm:  [-] Atrial fibrillation (01/09 0738) Resp:  [13-20] 20 (01/09 0731) BP: (112-153)/(73-95) 152/89 mmHg (01/09 0731) SpO2:  [99 %-100 %] 100 % (01/09 0731) Weight:  [213 lb 10 oz (96.9 kg)] 213 lb 10 oz (96.9 kg) (01/08 1825)  CBC:  Recent Labs Lab 05/27/15 1630  06/02/15 0600 06/03/15 0403  WBC 8.5  < > 8.6 7.9  NEUTROABS 5.9  --   --   --   HGB 13.3  < > 10.8* 10.3*  HCT 42.0  < > 34.1* 32.4*  MCV 77.2*  < >  78.6 78.1  PLT 372  < > 203 199  < > = values in this interval not displayed.  Basic Metabolic Panel:  Recent Labs Lab 06/01/15 0500  06/02/15 1731 06/03/15 0927  NA 140  < > 138 139  K 3.8  --   --  3.5  CL 105  --   --  101  CO2 25  --   --  26  GLUCOSE 196*  --   --  159*  BUN <5*  --   --  5*  CREATININE 0.79  --   --  0.84  CALCIUM 8.5*  --   --  8.9  < > = values in this interval not displayed.  Lipid Panel:     Component Value Date/Time   CHOL 111 05/29/2015 0510   TRIG 72 05/29/2015 0510   HDL 35* 05/29/2015 0510   CHOLHDL 3.2 05/29/2015 0510   VLDL 14 05/29/2015 0510   LDLCALC 62 05/29/2015 0510   HgbA1c:  Lab Results  Component Value Date   HGBA1C 7.6* 05/29/2015   Urine Drug Screen:     Component Value Date/Time   LABOPIA POSITIVE* 05/27/2015 1944   COCAINSCRNUR NONE DETECTED 05/27/2015 1944   LABBENZ NONE DETECTED 05/27/2015 1944   AMPHETMU NONE DETECTED 05/27/2015 1944   THCU NONE DETECTED 05/27/2015 1944   LABBARB NONE DETECTED 05/27/2015 1944  IMAGING   Ct Angio Head and Neck W/cm &/or Wo Cm  05/27/2015   Aborted study due to extravasation of IV contrast into the arm.   Ct Head Wo Contrast 05/27/2015   Right parietal/occipital lobe hemorrhage, likely due to hemorrhagic transformation of a right PCA distribution infarct. Mass effect with extension of hemorrhage into the right lateral ventricle and probable developing hydrocephal  05/28/2015 IMPRESSION: 1. Size stable parenchymal hemorrhage in the posterior right cerebral hemisphere, again suggestive of hemorrhagic PCA territory infarct. Mass effect with 8mm midline shift is stable. 2. Stable intraventricular extension with right temporal horn obstruction. 3. Correlation with reported outside imaging would be useful. Otherwise, surveillance is recommended.   05/30/2015  IMPRESSION: 1. Large right parieto-occipital intraparenchymal hemorrhage has increased mildly in size, measuring 8.4 x 5.0 cm,  with mildly worsened surrounding edema. The degree of leftward midline shift is relatively stable, measuring 7-8 mm. 2. Mild dilatation of the lateral ventricles in comparison to the prior study, concerning for mild obstructive hydrocephalus, with new blood noted filling the third ventricle. 3. Mild mass effect about the right cerebral peduncle, without evidence for transtentorial herniation at this time.     CT Head Wo Contrast 05/31/2015 Hemorrhagic right PCA infarct with intraventricular extension. The volume of hemorrhage and vasogenic edema is stable from yesterday but there is progressive lateral ventriculomegaly with increased leftward midline shift, now 13 mm.   CT Head Wo Contrast 06/01/2015 1. Hemorrhagic right PCA infarct status post interval hematoma evacuation. Residual hemorrhage as above. 2. Decreased midline shift, now 6 mm. 3. Decreased size of the ventricles.   TTE - - Left ventricle: The cavity size was normal. Systolic function was normal. The estimated ejection fraction was in the range of 60% to 65%. Wall motion was normal; there were no regional wall motion abnormalities. The study was not technically sufficient to allow evaluation of LV diastolic dysfunction due to atrial fibrillation. - Aortic valve: There was no regurgitation. - Aortic root: The aortic root was normal in size. - Mitral valve: Structurally normal valve. There was mild regurgitation. - Left atrium: The atrium was mildly dilated. - Right ventricle: The cavity size was normal. Wall thickness was normal. Systolic function was normal. - Right atrium: The atrium was normal in size. - Tricuspid valve: There was mild regurgitation. - Pulmonic valve: There was no regurgitation. - Pulmonary arteries: Systolic pressure was within the normal range. - Inferior vena cava: The vessel was normal in size. - Pericardium, extracardiac: There was no pericardial effusion.     PHYSICAL  EXAM  Temp:  [97.9 F (36.6 C)-99 F (37.2 C)] 98.3 F (36.8 C) (01/09 1200) Pulse Rate:  [81-114] 113 (01/09 0731) Resp:  [13-20] 20 (01/09 0731) BP: (112-153)/(73-95) 152/89 mmHg (01/09 0731) SpO2:  [99 %-100 %] 100 % (01/09 0731) Weight:  [213 lb 10 oz (96.9 kg)] 213 lb 10 oz (96.9 kg) (01/08 1825)  General - Well nourished, well developed, in no acute distress. HEENT:  Drain in place; moist mucous membranes; nose and throat clear Cardiovascular - Regular rate and rhythm. Abd:  Benign Extr:  No C/C/E  Mental Status -  Level of arousal and orientation to time, place, and person were intact. Language including expression, naming, repetition, comprehension was assessed and found intact. Attention span and concentration were normal. Fund of Knowledge was assessed and was intact.  Cranial Nerves II - XII - II - left homonymous hemianopia. III, IV, VI - Extraocular movements intact. V - Facial sensation  intact bilaterally. VII - Facial movement intact bilaterally.mild left nasolabial asymmetry VIII - Hearing intact bilaterally. X - Palate elevates symmetrically. XI - Chin turning & shoulder shrug intact bilaterally. XII - Tongue protrusion intact.  Motor Strength - The patient's strength was normal in all extremities and pronator drift was absent.  Bulk was normal and fasciculations were absent.   Motor Tone - Muscle tone was assessed at the neck and appendages and was normal.  Sensory - Light touch, temperature/pinprick were assessed and were symmetrical.    Coordination - The patient had normal movements in the hands with no ataxia or dysmetria.    Gait and Station - not tested.   ASSESSMENT/PLAN [name] is a 60 y.o. 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   He did not receive IV t-PA due to hemorrhagic stroke.  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 May consider resume eliquis in about 4-6 weeks when following up in clinic depending on clinic picture.  Ongoing aggressive stroke risk factor management  Therapy recommendations: Out pt PT  Disposition: Pending  Cerebral edema  Midline shift stable on CT head  Now off 3% saline  Na 139 on 06/03/2015  Recent right PCA infarct  With left hemianopia  Likely due to afib on on AC  Afib with RVR on eliquis PTA  Rate 90-115  Put back on home metoprolol and cardizem  Decrease cardizem to 120 due to bradycardia  Hold off eliquis due to ICH  Discontinue ASA as home medication  Hypertension  Malignant hypertension  Off cardene  Put back on home medications.  Now on:  Metoprolol 75mg  BID  Diltiazem XR 120mg  daily  Lisinopril 20mg  daily.  Will change to BID  Hyperlipidemia  Home meds:  No lipid lowering medications prior to admission  LDL 62, goal < 70  Continue statin at discharge  Diabetes  HgbA1c 7.6, goal < 7.0  SSI  Resume home meds with metformin and glipizide  CBG monitoring  Other Stroke Risk Factors  Advanced age  ETOH use  Hx stroke/TIA  Obstructive sleep apnea, on CPAP at home  Other Active Problems  HA - fioricet PRN  Nausea - zofran  PRN  Anemia  Hospital day # 7  VASCULAR NEUROLOGY ATTENDING NOTE Patient was seen and examined by me personally. I reviewed notes, independently viewed imaging studies, participated in medical decision making and plan of care. The laboratory and radiographic studies were personally reviewed by me.  ROS completed by me personally and pertinent positives fully documented.  Assessment and plan completed by me personally: Continue mobilization out of bed. Physical occupational speech therapy consults.Rehab consult. Transfer to neurology floor bed today. Patient will likely need to  be off anticoagulation for a month at least but will start aspirin in 1 week..  Condition is improved and critical  This patient is critically ill and at significant risk of neurological worsening, death and care requires constant monitoring of vital signs, hemodynamics,respiratory and cardiac monitoring, extensive review of multiple databases, frequent neurological assessment, discussion with family, other specialists and medical decision making of high complexity.  This critical care time does not reflect procedure time, or teaching time or supervisory time of PA/NP/Med Resident etc. but could involve care discussion time.  I spent 30 minutes of Neurocritical Care time in the care of  this patient.  SIGNED BY: Dr. Delia Heady, MD

## 2015-06-03 NOTE — Progress Notes (Signed)
Nutrition Follow-up  DOCUMENTATION CODES:   Non-severe (moderate) malnutrition in context of chronic illness  INTERVENTION:    Glucerna Shake PO TID, each supplement provides 220 kcal and 10 grams of protein  NUTRITION DIAGNOSIS:   Malnutrition related to chronic illness as evidenced by mild depletion of body fat, mild depletion of muscle mass, percent weight loss.  Ongoing  GOAL:   Patient will meet greater than or equal to 90% of their needs  Unmet, progressing.  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 Admitted 1/2 with large right ICH.   Patient reports that his appetite is "not what it used to be." He says that he ate breakfast this AM, but he did not realize that his lunch tray was in his room. He likes Glucerna supplements and agreed to drink them between meals to ensure adequate intake.   Diet Order:  Diet Carb Modified Fluid consistency:: Thin; Room service appropriate?: Yes  Skin:  Reviewed, no issues  Last BM:  1/6  Height:   Ht Readings from Last 1 Encounters:  06/02/15 6' 2.5" (1.892 m)    Weight:   Wt Readings from Last 1 Encounters:  06/02/15 213 lb 10 oz (96.9 kg)    Ideal Body Weight:  89 kg  BMI:  Body mass index is 27.07 kg/(m^2).  Estimated Nutritional Needs:   Kcal:  2200-2400  Protein:  115-130 grams  Fluid:  > 2.2 L/day  EDUCATION NEEDS:   No education needs identified at this time  Joaquin CourtsKimberly Harris, RD, LDN, CNSC Pager [phone] After Hours Pager [phone]

## 2015-06-03 NOTE — Progress Notes (Addendum)
Physician notified: Pearlean BrownieSethi via text page. At: 1733  Regarding: FYI: pt transferring to 5M11. Pt has non-sustained AF 150s while ambulating. Elsner ordered DC central line, ok to keep PIV out. Changing IV keppra to PO.   Dr. Damien FusiElnser at bedside: remove dressing, keep OTA. This RN DC honeycomb dressing, site CDI, staples present. Placed order for IV team to DC central line per verbal order.   Pt transferred to 5M11 on tele. CCMT and eICU notified of transfer, all belongings sent with patient, family present at time of transfer.

## 2015-06-03 NOTE — Progress Notes (Addendum)
UR COMPLETED  

## 2015-06-03 NOTE — Progress Notes (Signed)
Physical medicine rehabilitation consult requested chart reviewed. Status post right occipital intracerebral hemorrhage with craniotomy evacuation 05/31/2015. Need to establish physical occupational therapy evaluation after recent craniotomy and at that time will provide necessary recommendations and rehabilitation consult

## 2015-06-04 DIAGNOSIS — I6930 Unspecified sequelae of cerebral infarction: Secondary | ICD-10-CM | POA: Insufficient documentation

## 2015-06-04 DIAGNOSIS — I4891 Unspecified atrial fibrillation: Secondary | ICD-10-CM | POA: Insufficient documentation

## 2015-06-04 DIAGNOSIS — I619 Nontraumatic intracerebral hemorrhage, unspecified: Secondary | ICD-10-CM

## 2015-06-04 DIAGNOSIS — D62 Acute posthemorrhagic anemia: Secondary | ICD-10-CM | POA: Insufficient documentation

## 2015-06-04 DIAGNOSIS — I10 Essential (primary) hypertension: Secondary | ICD-10-CM | POA: Insufficient documentation

## 2015-06-04 LAB — GLUCOSE, CAPILLARY
GLUCOSE-CAPILLARY: 106 mg/dL — AB (ref 65–99)
GLUCOSE-CAPILLARY: 159 mg/dL — AB (ref 65–99)
Glucose-Capillary: 110 mg/dL — ABNORMAL HIGH (ref 65–99)
Glucose-Capillary: 79 mg/dL (ref 65–99)

## 2015-06-04 MED ORDER — PANTOPRAZOLE SODIUM 40 MG PO TBEC
40.0000 mg | DELAYED_RELEASE_TABLET | Freq: Every day | ORAL | Status: DC
  Administered 2015-06-04 – 2015-06-10 (×7): 40 mg via ORAL
  Filled 2015-06-04 (×7): qty 1

## 2015-06-04 NOTE — Progress Notes (Signed)
Occupational Therapy Treatment Patient Details Name: [name] MRN: [id number] DOB: [birth year] Today's Date: 06/04/2015    History of present illness pt presents with probable hemorrhagic transformation of prior PCA infarct 05/14/15 - head CT showed:  Large Rt parieto-occipital intraparenchymal hemorrhage, mild dilation of lateral ventricles, mild mass effect.  Pt underwent Rt occipital craniotomy for intracerebral hemorrhage on 05/31/15.  Pt with hx of A-fib, HTN, OSA, TKA, and DM.    OT comments  Focus of session on standing toileting and grooming incorporating scanning to L and instruction in compensatory strategies. Pt with L ankle pain and therefore requiring more assistance for balance in standing.  Follow Up Recommendations  SNF;Supervision/Assistance - 24 hour    Equipment Recommendations       Recommendations for Other Services      Precautions / Restrictions Precautions Precautions: Fall Precaution Comments: L Hemianopsia from recent R PCA Infarct Restrictions Weight Bearing Restrictions: No       Mobility Bed Mobility      General bed mobility comments: pt in chair  Transfers Overall transfer level: Needs assistance Equipment used: Rolling walker Transfers: Sit to/from Stand Sit to Stand: Min assist         General transfer comment: difficulty weight bearing on sore L ankle, further impeding balance with ambulation and standing ADL    Balance   Sitting-balance support: No upper extremity supported;Feet supported Sitting balance-Leahy Scale: Fair     Standing balance support: No upper extremity supported Standing balance-Leahy Scale: Poor Standing balance comment: limited somewhat by ankle pain                   ADL Overall ADL's : Needs assistance/impaired     Grooming: Wash/dry face;Oral care;Minimal assistance;Standing Grooming Details (indicate cue type and reason): difficulty with standing balance due to L ankle pain with weight  bearing                 Toilet Transfer: Minimal assistance;Ambulation;RW Toilet Transfer Details (indicate cue type and reason): stood to urinate           General ADL Comments: Pt with awareness of vision changes since prior CVA, but not functional implications.  Education provided.      Vision       Tracking/Visual Pursuits: Decreased smoothness of eye movement to LEFT inferior field;Decreased smoothness of horizontal tracking;Decreased smoothness of eye movement to RIGHT superior field         Additional Comments: Worked on locating ADL items across sink area during ADL and across table when seated.  Pt reports attempting to read newspaper today without it making sense, thought there was a problem with the print on the newspaper.  Instructed and practiced reading across midline and compensatory strategies for L HH. Pt requirng moderate verbal and visual cues to start reading at the first word of the sentence or to locate list of TV channels on L side of laminated room card.   Perception     Praxis      Cognition   Behavior During Therapy: Flat affect Overall Cognitive Status: Impaired/Different from baseline Area of Impairment: Attention;Safety/judgement;Awareness;Problem solving   Current Attention Level: Selective      Safety/Judgement: Decreased awareness of safety;Decreased awareness of deficits Awareness: Emergent Problem Solving: Slow processing;Requires verbal cues;Requires tactile cues General Comments: Pt scanning environment for blanket on L without cues.    Extremity/Trunk Assessment               Exercises  Shoulder Instructions       General Comments      Pertinent Vitals/ Pain       Pain Assessment: Faces Pain Score: 8  Faces Pain Scale: Hurts even more Pain Location: L ankle with weight bearing Pain Descriptors / Indicators: Grimacing;Guarding Pain Intervention(s): Limited activity within patient's tolerance;Monitored during  session;Repositioned  Home Living                                          Prior Functioning/Environment              Frequency Min 3X/week     Progress Toward Goals  OT Goals(current goals can now be found in the care plan section)  Progress towards OT goals: Progressing toward goals  Acute Rehab OT Goals Patient Stated Goal: none stated  Plan Discharge plan needs to be updated    Co-evaluation                 End of Session Equipment Utilized During Treatment: Gait belt   Activity Tolerance Patient tolerated treatment well   Patient Left in chair;with call bell/phone within reach;with chair alarm set   Nurse Communication          Time: 1345-1416 OT Time Calculation (min): 31 min  Charges: OT General Charges $OT Visit: 1 Procedure OT Treatments $Self Care/Home Management : 8-22 mins $Therapeutic Activity: 8-22 mins  [name], [name] 06/04/2015, 2:28 PM  [phone]

## 2015-06-04 NOTE — Care Management Note (Signed)
Case Management Note  Patient Details  Name: [name] MRN: [id number] Date of Birth: Jan 06, 1956  Subjective/Objective:                    Action/Plan: Patient with [employer] insurance and unable to be admitted to CIR. Plan will be for SNF depending on [facility] recommendations. CM will continue to follow for discharge needs.   Expected Discharge Date:                  Expected Discharge Plan:     In-House Referral:     Discharge planning Services     Post Acute Care Choice:    Choice offered to:     DME Arranged:    DME Agency:     HH Arranged:    HH Agency:     Status of Service:     Medicare Important Message Given:    Date Medicare IM Given:    Medicare IM give by:    Date Additional Medicare IM Given:    Additional Medicare Important Message give by:     If discussed at Long Length of Stay Meetings, dates discussed:    Additional Comments:  Kermit BaloKelli F Mayrin Schmuck, RN 06/04/2015, 2:27 PM

## 2015-06-04 NOTE — Progress Notes (Signed)
Rehab admissions - I met with patient.  He has [employer].  They do not authorize or pay for acute inpatient rehab admissions.  They may, however, pay for a SNF stay.  It would be in the patient's best interest to pursue SNF for his rehab at this time.  Patient is in agreement to SNF if the [place] will pay for his stay.  I will let case manager and social worker know of patient wishes.  Call me for questions.  #[phone]

## 2015-06-04 NOTE — Consult Note (Signed)
Physical Medicine and Rehabilitation Consult Reason for Consult: Large right parieto-occipital intraparenchymal hemorrhage Referring Physician: Dr. Pearlean Brownie  HPI: [name] is a 60 y.o. 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 Patient lives with his wife, who is available 24/7 on discharge, but cannot provide physical support. Independent prior to admission. One level home with ramped entrance. Placed on Eliquis 5 mg twice a day for CVA prophylaxis and also continued aspirin. Presented to [facility] 05/27/2015 with right-sided headache, nausea and generalized fatigue. CT of the head showed a large right parieto-occipital parenchymal hemorrhage. Blood pressure elevated with systolic in the 170s and placed on a Cardene drip. Neurosurgery consult close observation. Patient with gradual deterioration in function of left side weakness and progressive somnolence. Follow-up cranial CT scan demonstrated developing hydrocephalus. Underwent right occipital craniotomy evacuation of intracerebral hemorrhage 05/31/2015 per Dr. Danielle Dess.Eliquis held due to ICH. Remains on Cardizem for cardiac rate control. Tolerating a regular consistency diet. Keppra for seizure prophylaxis. Latest follow-up cranial CT scan showed decreased midline shift now 6 mm. Decreased size of ventricles. Bouts of elevated heart rate with resuming therapies 167 bpm and monitored. Physical and occupational therapy follow-up postoperatively with recommendations of physical medicine rehabilitation consult.  Review of Systems  Constitutional: Positive for malaise/fatigue. Negative for fever and chills.  HENT: Negative for hearing loss.   Eyes: Positive for blurred vision.  Respiratory: Negative for cough and shortness of breath.   Cardiovascular: Negative for chest  pain and leg swelling.  Gastrointestinal: Positive for constipation. Negative for abdominal pain.       GERD  Genitourinary: Negative for dysuria and hematuria.  Musculoskeletal: Positive for back pain and joint pain.  Skin: Negative for rash.  Neurological: Positive for weakness and headaches. Negative for seizures.  All other systems reviewed and are negative.  Past Medical History  Diagnosis Date  . Hypertension   . Dysrhythmia     afib  . Sleep apnea     cpap for 2 yrs  . GERD (gastroesophageal reflux disease)   . Arthritis   . Complication of anesthesia     hx twice of tachy after surgery  . Refusal of blood transfusions as patient is Jehovah's Witness   . PONV (postoperative nausea and vomiting)   . 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 Stroke [facility])    Past Surgical History  Procedure Laterality Date  . Lithotriopsy  15    ? stone -could not find  . Joint replacement Right 10    knee  . Back surgery      x4 2 diskectomies -2 fusions last 08  . Knee arthroplasty Left 12/24/2014    Procedure: COMPUTER ASSISTED TOTAL KNEE ARTHROPLASTY;  Surgeon: Eldred Manges, MD;  Location: [facility] OR;  Service: Orthopedics;  Laterality: Left;  . Total knee arthroplasty Left 12/24/2014  . Craniotomy Right 05/31/2015    Procedure: Occipital Craniotomy for Intracerebral Hematoma ;  Surgeon: [name], MD;  Location: [facility] NEURO ORS;  Service: Neurosurgery;  Laterality: Right;   No family history on file. Social History:  reports that he has never smoked. He has never used smokeless tobacco. He reports that he drinks alcohol. He reports that he does not use illicit drugs. Allergies:  Allergies  Allergen Reactions  . Penicillins Swelling  Tongue swells  . Blood-Group Specific Substance     Jehovah's Witness   Medications Prior to Admission  Medication Sig Dispense Refill  . aspirin EC 325 MG tablet Take 325 mg by mouth daily.    Marland Kitchen. diltiazem (DILACOR XR) 120  MG 24 hr capsule Take 240 mg by mouth daily.     Marland Kitchen. gabapentin (NEURONTIN) 600 MG tablet Take 600 mg by mouth 4 (four) times daily.    Marland Kitchen. glipiZIDE (GLUCOTROL) 10 MG tablet Take 10 mg by mouth daily before breakfast.    . HYDROcodone-acetaminophen (NORCO) 10-325 MG tablet Take 1 tablet by mouth every 6 (six) hours as needed for severe pain.    . metFORMIN (GLUCOPHAGE) 850 MG tablet Take 850 mg by mouth 2 (two) times daily with a meal.    . metoprolol (LOPRESSOR) 50 MG tablet Take 75 mg by mouth 2 (two) times daily.    Marland Kitchen. omeprazole (PRILOSEC) 20 MG capsule Take 20 mg by mouth daily.    . methocarbamol (ROBAXIN) 500 MG tablet Take 1 tablet (500 mg total) by mouth every 6 (six) hours as needed for muscle spasms. 60 tablet 0  . oxyCODONE-acetaminophen (PERCOCET) 10-325 MG per tablet Take 1 tablet by mouth every 6 (six) hours as needed. 60 tablet 0    Home: Home Living Family/patient expects to be discharged to:: Private residence Living Arrangements: Spouse/significant other Available Help at Discharge: Family, Available PRN/intermittently Type of Home: House Home Access: Ramped entrance Home Layout: One level Bathroom Shower/Tub: Health visitorWalk-in shower Bathroom Toilet: Standard Home Equipment: Bankerhower seat  Functional History: Prior Function Level of Independence: Needs assistance Gait / Transfers Assistance Needed: independent ADL's / Homemaking Assistance Needed: Since CVA in Dec, pt requires supervision for ADLs and assist with IADLs.  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 Difficulty with problem solving. Became twisted up in covers and requried physical assist to assist pt to edge of bed. Unable to shift weight forward toward bedrails to move to edge of  bed Transfers Overall transfer level: Needs assistance Equipment used: 1 person hand held assist, 2 person hand held assist Transfers: Sit to/from Stand Sit to Stand: Mod assist, +2 safety/equipment Stand pivot transfers: Mod assist, +2 safety/equipment General transfer comment: pt with decreased attention on L side and requires cues to move Lt LE during pivot.  Assist to boost up from chair and to steady during pivot w/ flexed posture Ambulation/Gait Ambulation/Gait assistance: Min guard Ambulation Distance (Feet): 200 Feet Assistive device: None Gait Pattern/deviations: Step-through pattern, Decreased stride length, Drifts right/left, Shuffle General Gait Details: not attempted for pt's safety; HR up to 167 w/ stand pivot to [facility] Gait velocity interpretation: Below normal speed for age/gender    ADL: ADL Overall ADL's : Needs assistance/impaired Eating/Feeding: Supervision/ safety, Set up Eating/Feeding Details (indicate cue type and reason): eating food on R half of platte. Only cut up food on R half of plate Grooming: Minimal assistance Upper Body Bathing: Minimal assitance Lower Body Bathing: Moderate assistance Upper Body Dressing : Minimal assistance, Sitting Lower Body Dressing: Moderate assistance, Sit to/from stand Toilet Transfer: Min guard, Ambulation, Comfort height toilet Toileting- Clothing Manipulation and Hygiene: Minimal assistance, Sit to/from stand Functional mobility during ADLs: Moderate assistance General ADL Comments: Pt with functional decline  since surgery. Pt requires min A to maintain midline postural control sitting EOB to complete ADL . Mod A for LB Dressing. Losing balance when lifting 1 leg in sitting to donn socks.  Cognition: Cognition Overall Cognitive Status: Impaired/Different from baseline Orientation Level: Oriented X4 Cognition Arousal/Alertness: Awake/alert Behavior During Therapy: Flat affect Overall Cognitive Status: Impaired/Different  from baseline Area of Impairment: Attention, Safety/judgement, Awareness, Problem solving Current Attention Level: Sustained Memory: Decreased short-term memory Safety/Judgement: Decreased awareness of safety, Decreased awareness of deficits Awareness: Emergent Problem Solving: Slow processing General Comments: When cutting up salad on tray pt only cuts Rt half of plate.  Pt seems unaware of his deficits and requires cues to move Lt LE while pivoting.  Blood pressure 110/67, pulse 91, temperature 97.6 F (36.4 C), temperature source Oral, resp. rate 18, height 6' 2.5" (1.892 m), weight 96.9 kg (213 lb 10 oz), SpO2 100 %. Physical Exam  Vitals reviewed. Constitutional: He is oriented to person, place, and time. He appears well-developed and well-nourished.  HENT:  Right Ear: External ear normal.  Left Ear: External ear normal.  Mouth/Throat: Oropharynx is clear and moist.  Eyes: Conjunctivae are normal. No scleral icterus.  Pupils round and reactive to light  Neck: Normal range of motion. Neck supple. No thyromegaly present.  Cardiovascular:  Irregular irregular  Respiratory: Effort normal and breath sounds normal. No respiratory distress.  GI: Soft. Bowel sounds are normal. He exhibits no distension.  Musculoskeletal:  PROM WNL Minimal edema  Neurological: He is alert and oriented to person, place, and time.  Follows commands.  Fair awareness of deficits RUE/RLE: 5/5 proximal to distal LUE/LLE: 4+/5 proximal to distal 3+ DTRs LUE Sensation intact to light touch  Skin: Skin is warm and dry.  Psychiatric: He has a normal mood and affect. His behavior is normal. Thought content normal.   Results for orders placed or performed during the hospital encounter of 05/27/15 (from the past 24 hour(s))  Glucose, capillary     Status: Abnormal   Collection Time: 06/03/15  7:31 AM  Result Value Ref Range   Glucose-Capillary 104 (H) 65 - 99 mg/dL   Comment 1 Notify RN    Comment 2  Document in Chart   Basic metabolic panel     Status: Abnormal   Collection Time: 06/03/15  9:27 AM  Result Value Ref Range   Sodium 139 135 - 145 mmol/L   Potassium 3.5 3.5 - 5.1 mmol/L   Chloride 101 101 - 111 mmol/L   CO2 26 22 - 32 mmol/L   Glucose, Bld 159 (H) 65 - 99 mg/dL   BUN 5 (L) 6 - 20 mg/dL   Creatinine, Ser 1.61 0.61 - 1.24 mg/dL   Calcium 8.9 8.9 - 09.6 mg/dL   GFR calc non Af Amer >60 >60 mL/min   GFR calc Af Amer >60 >60 mL/min   Anion gap 12 5 - 15  Glucose, capillary     Status: None   Collection Time: 06/03/15 12:39 PM  Result Value Ref Range   Glucose-Capillary 84 65 - 99 mg/dL  Glucose, capillary     Status: Abnormal   Collection Time: 06/03/15  5:26 PM  Result Value Ref Range   Glucose-Capillary 151 (H) 65 - 99 mg/dL   Comment 1 Notify RN    Comment 2 Document in Chart   Glucose, capillary     Status: Abnormal   Collection Time: 06/03/15 10:16 PM  Result Value Ref Range   Glucose-Capillary 165 (  H) 65 - 99 mg/dL   Comment 1 Notify RN    Comment 2 Document in Chart    Dg Ankle Left Port  06/03/2015  CLINICAL DATA:  60 year old male with left-sided ankle pain, aggravated by walking. EXAM: PORTABLE LEFT ANKLE - 2 VIEW COMPARISON:  No priors. FINDINGS: Two views of the left ankle demonstrate no acute displaced fracture, subluxation or dislocation. There is joint space narrowing, subchondral sclerosis and osteophyte formation at the tibiotalar joint, compatible with mild to moderate osteoarthritis. Irregularity of the medial malleolus, likely related to remote trauma. IMPRESSION: 1. No acute radiographic abnormality of the left ankle. Electronically Signed   By: Trudie Reed M.D.   On: 06/03/2015 17:32    Assessment/Plan: Diagnosis: Large right parieto-occipital intraparenchymal hemorrhage Labs and images independently reviewed.  Records reviewed and summated above. Stroke: Continue secondary stroke prophylaxis and Risk Factor Modification listed below:    Antiplatelet therapy:  Hold due to recent bleed Blood Pressure Management:  Continue current medication with prn's with permisive HTN per primary team Statin Agent:  Consider initiation after weighing risks and benefits Diabetes management:  Left sided hemiparesis Motor recovery: Fluoxetine  1. Does the need for close, 24 hr/day medical supervision in concert with the patient's rehab needs make it unreasonable for this patient to be served in a less intensive setting? Yes  2. 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 Due to safety, disease management, medication administration and patient education, does the patient require 24 hr/day rehab nursing? Yes 4. Does the patient require coordinated care of a physician, rehab nurse, PT (1-2 hrs/day, 5 days/week) and OT (1-2 hrs/day, 5 days/week) to address physical and functional deficits in the context of the above medical diagnosis(es)? Yes Addressing deficits in the following areas: balance, endurance, locomotion, strength, transferring, toileting and psychosocial support 5. Can the patient actively participate in an intensive therapy program of at least 3 hrs of therapy per day at least 5 days per week? Yes 6. The potential for patient to make measurable gains while on inpatient rehab is excellent 7. Anticipated functional outcomes upon discharge from inpatient rehab are modified independent and supervision  with PT, modified independent and supervision with OT, n/a with SLP. 8. Estimated rehab length of stay to reach the above functional goals is: 14-17 days. 9. Does the patient have adequate social supports  and living environment to accommodate these discharge functional goals? Yes 10. Anticipated D/C setting: Home 11. Anticipated post D/C treatments: HH therapy and Home excercise program 12. Overall Rehab/Functional Prognosis: good  RECOMMENDATIONS: This patient's condition is appropriate for continued rehabilitative care in the following setting: CIR Patient has agreed to participate in recommended program. Yes Note that insurance prior authorization may be required for reimbursement for recommended care.  Comment: Rehab Admissions Coordinator to follow up.  Maryla Morrow, MD 06/04/2015

## 2015-06-04 NOTE — Progress Notes (Signed)
Patient ID: [name], male   DOB: [birth year], 60 y.o.   MRN: [id number] Vital signs are stable Motor function remains intact X-ray of ankle shows mild arthritic changes but no fractures or dislocations Will continue his none [place] inflammatory in the form of Indocin for now Neurologically unchanged Mild left hemiparesis and neglect syndrome

## 2015-06-04 NOTE — Progress Notes (Signed)
RT note: Placed patient on CPAP 10 cmH2O with full face mask per RCP protocol. Patient resting at this time.

## 2015-06-04 NOTE — Progress Notes (Signed)
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  Pt underwent Rt occipital craniotomy for intracerebral hemorrhage on 05/31/15.  Pt with hx of A-fib, HTN, OSA, TKA, and DM.     PT Comments    Patient continues with Lt ankle pain and lower leg edema (not just ankle). Pain now over anterior aspect of ankle. Able to ambulate with RW, however likely could benefit from an ankle brace for support/comfort. Continues to require cues for scanning Lt environment to avoid obstacles when walking.    Follow Up Recommendations  CIR     Equipment Recommendations  Other (comment) (TBD at next venue of care)    Recommendations for Other Services       Precautions / Restrictions Precautions Precautions: Fall Precaution Comments: L Hemianopsia from recent R PCA Infarct Restrictions Weight Bearing Restrictions: No    Mobility  Bed Mobility Overal bed mobility: Needs Assistance Bed Mobility: Supine to Sit     Supine to sit: Min guard     General bed mobility comments: exit to his Rt; incr time and effort; vc to complete task  Transfers Overall transfer level: Needs assistance Equipment used: 1 person hand held assist Transfers: Sit to/from Stand Sit to Stand: Min assist         General transfer comment: wide stance with nearly all weight on RLE; when asked to bring LLE under him ("feet shoulder width apart") he noted incr ankle pain and remained shifted over RLE  Ambulation/Gait Ambulation/Gait assistance: Min assist Ambulation Distance (Feet): 35 Feet Assistive device: Rolling walker (2 wheeled) Gait Pattern/deviations: Step-through pattern;Decreased stride length;Antalgic    Gait velocity interpretation: Below normal speed for age/gender General Gait Details: good use of LUE to support weight (due to Lt ankle pain); min assist for balance and maneuvering RW; vc for pt to scan to his left to avoid objects on his left   Stairs            Wheelchair Mobility    Modified Rankin (Stroke Patients Only) Modified Rankin (Stroke Patients Only) Pre-Morbid Rankin Score: Slight disability Modified Rankin: Moderately severe disability     Balance   Sitting-balance support: No upper extremity supported;Feet supported Sitting balance-Leahy Scale: Fair     Standing balance support: No upper extremity supported Standing balance-Leahy Scale: Poor Standing balance comment: partly limited by Lt ankle pain                    Cognition Arousal/Alertness: Awake/alert Behavior During Therapy: Flat affect Overall Cognitive Status: Impaired/Different from baseline Area of Impairment: Attention;Safety/judgement;Problem solving;Awareness   Current Attention Level: Selective     Safety/Judgement: Decreased awareness of safety Awareness: Emergent Problem Solving: Slow processing;Requires verbal cues      Exercises General Exercises - Lower Extremity Ankle Circles/Pumps: AROM;Left;5 reps Long Arc Quad: AROM;Left;5 reps Hip Flexion/Marching: AROM;Left    General Comments        Pertinent Vitals/Pain Pain Assessment: 0-10 Pain Score: 8  Pain Location: Lt anterior ankle Pain Descriptors / Indicators: Discomfort Pain Intervention(s): Limited activity within patient's tolerance;Monitored during session;Repositioned;Other (comment) (educated in use of RW)    Home Living  Prior Function            PT Goals (current goals can now be found in the care plan section) Acute Rehab PT Goals Patient Stated Goal: none stated Time For Goal Achievement: 06/06/15 Progress towards PT goals: Progressing toward goals     Frequency  Min 4X/week    PT Plan Current plan remains appropriate    Co-evaluation PT/OT/SLP Co-Evaluation/Treatment:  (initially, then PT returned to assist w/ pivot to/from [facility])           End of Session Equipment Utilized During Treatment: Gait belt Activity Tolerance: Patient limited by pain Patient left: in chair;with call bell/phone within reach;with chair alarm set     Time: 1134-1150 PT Time Calculation (min) (ACUTE ONLY): 16 min  Charges:  $Gait Training: 8-22 mins                    G Codes:      Larhonda Dettloff 06/24/15, 12:13 PM Pager [phone]

## 2015-06-04 NOTE — Progress Notes (Signed)
STROKE TEAM PROGRESS NOTE  HISTORY DOCUMENTED AT TIME OF ADMISSION  [name] is an 60 y.o. 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. He was started at discharge from the hospital 3 days after the stroke onset, on Eliquis 5 mg twice a day. He has been compliant with the medication, took his last dose this morning at about 8 AM. He is also on aspirin 325 mg daily for the past few years and continues to take aspirin now, along with Eliquis. 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 This morning he woke up with right-sided headache, nausea and generalized fatigue but no other focal neurological symptoms. He was advised to go to the ER by his wife for further evaluation. Initial CT of the head showed a large right parieto-occipital parenchymal hemorrhage. Neurosurgery evaluated the patient in the ER and at this time no surgical intervention is planned. His blood pressure was elevated to 170s systolic has been started on nicardipine drip to maintain goal systolic blood pressure less than 130 to reduce the risk of hemorrhagic expansion. He was also started on Kcentra, after consultation with pharmacy.    SUBJECTIVE (INTERVAL HISTORY) Patient is awake and alert.    OOB in chair today.   No complaints. Left ankle x-ray shows no fracture and patient states swelling and pain and diminished OBJECTIVE Temp:  [97.5 F (36.4 C)-98.9 F (37.2 C)] 97.5 F (36.4 C) (01/10 0926) Pulse Rate:  [80-110] 80 (01/10 0926) Cardiac Rhythm:  [-] Atrial fibrillation (01/10 0821) Resp:  [16-24] 20 (01/10 0926) BP: (110-150)/(67-93) 127/89 mmHg (01/10 0926) SpO2:  [93 %-100 %] 98 % (01/10 0926)  CBC:   Recent Labs Lab 06/02/15 0600 06/03/15 0403  WBC 8.6 7.9  HGB 10.8* 10.3*  HCT 34.1* 32.4*  MCV 78.6 78.1  PLT 203 199    Basic Metabolic Panel:  Recent  Labs Lab 06/01/15 0500  06/02/15 1731 06/03/15 0927  NA 140  < > 138 139  K 3.8  --   --  3.5  CL 105  --   --  101  CO2 25  --   --  26  GLUCOSE 196*  --   --  159*  BUN <5*  --   --  5*  CREATININE 0.79  --   --  0.84  CALCIUM 8.5*  --   --  8.9  < > = values in this interval not displayed.  Lipid Panel:     Component Value Date/Time   CHOL 111 05/29/2015 0510   TRIG 72 05/29/2015 0510   HDL 35* 05/29/2015 0510   CHOLHDL 3.2 05/29/2015 0510   VLDL 14 05/29/2015 0510   LDLCALC 62 05/29/2015 0510   HgbA1c:  Lab Results  Component Value Date   HGBA1C 7.6* 05/29/2015   Urine Drug Screen:     Component Value Date/Time   LABOPIA POSITIVE* 05/27/2015 1944   COCAINSCRNUR NONE DETECTED 05/27/2015 1944   LABBENZ NONE DETECTED 05/27/2015 1944   AMPHETMU NONE DETECTED 05/27/2015 1944   THCU NONE DETECTED 05/27/2015 1944   LABBARB NONE DETECTED 05/27/2015 1944      IMAGING   Ct Angio Head and Neck W/cm &/or Wo Cm  05/27/2015   Aborted study due to extravasation of IV contrast into the arm.   Ct Head Wo Contrast 05/27/2015   Right parietal/occipital lobe hemorrhage,  likely due to hemorrhagic transformation of a right PCA distribution infarct. Mass effect with extension of hemorrhage into the right lateral ventricle and probable developing hydrocephal  05/28/2015 IMPRESSION: 1. Size stable parenchymal hemorrhage in the posterior right cerebral hemisphere, again suggestive of hemorrhagic PCA territory infarct. Mass effect with 8mm midline shift is stable. 2. Stable intraventricular extension with right temporal horn obstruction. 3. Correlation with reported outside imaging would be useful. Otherwise, surveillance is recommended.   05/30/2015  IMPRESSION: 1. Large right parieto-occipital intraparenchymal hemorrhage has increased mildly in size, measuring 8.4 x 5.0 cm, with mildly worsened surrounding edema. The degree of leftward midline shift is relatively stable, measuring 7-8 mm.  2. Mild dilatation of the lateral ventricles in comparison to the prior study, concerning for mild obstructive hydrocephalus, with new blood noted filling the third ventricle. 3. Mild mass effect about the right cerebral peduncle, without evidence for transtentorial herniation at this time.     CT Head Wo Contrast 05/31/2015 Hemorrhagic right PCA infarct with intraventricular extension. The volume of hemorrhage and vasogenic edema is stable from yesterday but there is progressive lateral ventriculomegaly with increased leftward midline shift, now 13 mm.   CT Head Wo Contrast 06/01/2015 1. Hemorrhagic right PCA infarct status post interval hematoma evacuation. Residual hemorrhage as above. 2. Decreased midline shift, now 6 mm. 3. Decreased size of the ventricles.   TTE - - Left ventricle: The cavity size was normal. Systolic function was normal. The estimated ejection fraction was in the range of 60% to 65%. Wall motion was normal; there were no regional wall motion abnormalities. The study was not technically sufficient to allow evaluation of LV diastolic dysfunction due to atrial fibrillation. - Aortic valve: There was no regurgitation. - Aortic root: The aortic root was normal in size. - Mitral valve: Structurally normal valve. There was mild regurgitation. - Left atrium: The atrium was mildly dilated. - Right ventricle: The cavity size was normal. Wall thickness was normal. Systolic function was normal. - Right atrium: The atrium was normal in size. - Tricuspid valve: There was mild regurgitation. - Pulmonic valve: There was no regurgitation. - Pulmonary arteries: Systolic pressure was within the normal range. - Inferior vena cava: The vessel was normal in size. - Pericardium, extracardiac: There was no pericardial effusion.     PHYSICAL EXAM  Temp:  [97.5 F (36.4 C)-98.9 F (37.2 C)] 97.5 F (36.4 C) (01/10 0926) Pulse Rate:  [80-110] 80 (01/10  0926) Resp:  [16-24] 20 (01/10 0926) BP: (110-150)/(67-93) 127/89 mmHg (01/10 0926) SpO2:  [93 %-100 %] 98 % (01/10 0926)  General - Well nourished, well developed, in no acute distress. HEENT:  Drain in place; moist mucous membranes; nose and throat clear Cardiovascular - Regular rate and rhythm. Abd:  Benign Extr:  No C/C/E  Mental Status -  Level of arousal and orientation to time, place, and person were intact. Language including expression, naming, repetition, comprehension was assessed and found intact. Attention span and concentration were normal. Fund of Knowledge was assessed and was intact.  Cranial Nerves II - XII - II - left homonymous hemianopia. III, IV, VI - Extraocular movements intact. V - Facial sensation intact bilaterally. VII - Facial movement intact bilaterally.mild left nasolabial asymmetry VIII - Hearing intact bilaterally. X - Palate elevates symmetrically. XI - Chin turning & shoulder shrug intact bilaterally. XII - Tongue protrusion intact.  Motor Strength - The patient's strength was normal in all extremities and pronator drift was absent.  Bulk was normal  and fasciculations were absent.   Motor Tone - Muscle tone was assessed at the neck and appendages and was normal.  Sensory - Light touch, temperature/pinprick were assessed and were symmetrical.    Coordination - The patient had normal movements in the hands with no ataxia or dysmetria.    Gait and Station - not tested.   ASSESSMENT/PLAN Mr. JEVAUGHN DEGOLLADO is a 60 y.o. 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  He did not receive IV t-PA due to hemorrhagic stroke.  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 May consider resume eliquis in about 4-6 weeks when following up in clinic depending on clinic picture.  Ongoing aggressive stroke risk factor management  Therapy recommendations: SNF Disposition: SNF Cerebral edema  Midline shift stable on CT head  Now off 3% saline  Na 139 on 06/03/2015  Recent right PCA infarct  With left hemianopia  Likely due to afib on on AC  Afib with RVR on eliquis PTA  Rate 90-115  Put back on home metoprolol and cardizem  Decrease cardizem to 120 due to bradycardia  Hold off eliquis due to ICH  Discontinue ASA as home medication  Hypertension  Malignant hypertension  Off cardene  Put back on home medications.  Now on:  Metoprolol 75mg  BID  Diltiazem XR 120mg  daily  Lisinopril 20mg  daily.  Will change to BID  Hyperlipidemia  Home meds:  No lipid lowering medications prior to admission  LDL 62, goal < 70  Continue statin at discharge  Diabetes  HgbA1c 7.6, goal < 7.0  SSI  Resume home meds with metformin and glipizide  CBG monitoring  Other Stroke Risk Factors  Advanced age  ETOH use  Hx stroke/TIA  Obstructive sleep apnea, on CPAP at home  Other Active Problems  HA - fioricet PRN  Nausea - zofran PRN  Anemia  Hospital day # 8  VASCULAR NEUROLOGY ATTENDING NOTE Patient was seen and examined by me personally. I reviewed notes, independently viewed imaging studies, participated in  medical decision making and plan of care. The laboratory and radiographic studies were personally reviewed by me.  ROS completed by me personally and pertinent positives fully documented.  Assessment and plan completed by me personally: Continue mobilization out of bed. Physical occupational speech therapy recommend SNFl . Patient will likely need to be off anticoagulation for a month at least but will start aspirin in 1 week..  Condition is improved       SIGNED BY: Dr. Delia Heady, MD

## 2015-06-05 DIAGNOSIS — I6930 Unspecified sequelae of cerebral infarction: Secondary | ICD-10-CM

## 2015-06-05 LAB — URIC ACID: URIC ACID, SERUM: 4.8 mg/dL (ref 4.4–7.6)

## 2015-06-05 LAB — GLUCOSE, CAPILLARY
GLUCOSE-CAPILLARY: 94 mg/dL (ref 65–99)
GLUCOSE-CAPILLARY: 118 mg/dL — AB (ref 65–99)
Glucose-Capillary: 117 mg/dL — ABNORMAL HIGH (ref 65–99)
Glucose-Capillary: 124 mg/dL — ABNORMAL HIGH (ref 65–99)

## 2015-06-05 MED ORDER — COLCHICINE 0.6 MG PO TABS
0.6000 mg | ORAL_TABLET | Freq: Once | ORAL | Status: AC
  Administered 2015-06-05: 0.6 mg via ORAL

## 2015-06-05 MED ORDER — COLCHICINE 0.6 MG PO TABS
1.2000 mg | ORAL_TABLET | Freq: Once | ORAL | Status: AC
  Administered 2015-06-05: 1.2 mg via ORAL
  Filled 2015-06-05: qty 2

## 2015-06-05 MED ORDER — COLCHICINE 0.6 MG PO TABS
0.6000 mg | ORAL_TABLET | Freq: Every day | ORAL | Status: DC
  Administered 2015-06-06 – 2015-06-11 (×6): 0.6 mg via ORAL
  Filled 2015-06-05 (×7): qty 1

## 2015-06-05 NOTE — Progress Notes (Addendum)
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  Pt underwent Rt occipital craniotomy for intracerebral hemorrhage on 05/31/15.  Pt with hx of A-fib, HTN, OSA, TKA, and DM.     PT Comments    Patient reports Lt ankle pain is worse today (point tenderness over anterior-medial aspect). Unable to tolerate even slightest weight bearing on Lt foot. Dr. Danielle Dess in during session and ? If due to gout, however pt denies h/o gout. Current ankle pain limiting ability to progress towards PT goals. Will notify Dr. Pearlean Brownie.   Follow Up Recommendations  SNF (CIR not an option due to [employer])     Equipment Recommendations  Other (comment) (TBD at next venue of care)    Recommendations for Other Services       Precautions / Restrictions Precautions Precautions: Fall Precaution Comments: L Hemianopsia from recent R PCA Infarct Restrictions Weight Bearing Restrictions: No    Mobility  Bed Mobility Overal bed mobility: Needs Assistance Bed Mobility: Supine to Sit     Supine to sit: Supervision     General bed mobility comments: exit to his Rt; incr time and effort; vc to untangle Lt foot from covers (inattention)  Transfers Overall transfer level: Needs assistance Equipment used: Rolling walker (2 wheeled) Transfers: Sit to/from Stand Sit to Stand: Min guard         General transfer comment: all weight on RLE due to Lt ankle pain; unable to tolerate foot flat on floor   Ambulation/Gait Ambulation/Gait assistance: Min guard Ambulation Distance (Feet): 5 Feet Assistive device: Rolling walker (2 wheeled) Gait Pattern/deviations: Step-to pattern;Decreased stride  length;Antalgic     General Gait Details: inability to weight bear on Lt foot due to pain; vc for sequencing with RW   Stairs            Wheelchair Mobility    Modified Rankin (Stroke Patients Only) Modified Rankin (Stroke Patients Only) Pre-Morbid Rankin Score: Slight disability Modified Rankin: Moderately severe disability     Balance     Sitting balance-Leahy Scale: Fair       Standing balance-Leahy Scale: Poor                      Cognition Arousal/Alertness: Awake/alert Behavior During Therapy: WFL for tasks assessed/performed Overall Cognitive Status: Impaired/Different from baseline Area of Impairment: Awareness           Awareness: Intellectual (unaware lt foot tangled in covers)        Exercises General Exercises - Lower Extremity Ankle Circles/Pumps: AROM;Left;15 reps (PROM inversion and eversion with no incr pain; pain with wt-)    General Comments        Pertinent Vitals/Pain Pain Assessment: 0-10 Pain Score: 9  Pain Location: Lt ankle Pain Descriptors / Indicators: Grimacing;Guarding Pain Intervention(s): Limited activity within patient's tolerance;Monitored during session;Repositioned;Other (comment) (educated in ROM for decr edema)    Home Living                      Prior Function            PT Goals (current goals can now be found in the care plan section) Acute Rehab PT Goals Patient Stated Goal: none stated  Time For Goal Achievement: 06/06/15 Progress towards PT goals: Not progressing toward goals - comment (due to Lt ankle pain)    Frequency  Min 3X/week    PT Plan Discharge plan needs to be updated;Frequency needs to be updated    Co-evaluation PT/OT/SLP Co-Evaluation/Treatment:  (initially, then [facility]           End of Session   Activity Tolerance: Patient limited by pain Patient left: in chair;with call bell/phone within reach;with chair alarm set      Time: [id number] PT Time Calculation (min) (ACUTE ONLY): 18 min  Charges:  $Therapeutic Activity: 8-22 mins                    G Codes:      Teleshia Lemere 06/05/2015, 8:58 AM Pager [phone]

## 2015-06-05 NOTE — [place] FL2 (Signed)
[place] MEDICAID FL2 LEVEL OF CARE SCREENING TOOL     IDENTIFICATION  Patient Name: [name] Birthdate: 1956-04-05 Sex: male Admission Date (Current Location): 05/27/2015  [facility] and [place] Number:  Producer, television/film/video and Address:  [facility], [address] [place], [place] [address]      Provider Number: [id number]  Attending Physician Name and Address:  Micki Riley, MD  Relative Name and Phone Number:       Current Level of Care: Hospital Recommended Level of Care: Skilled Nursing Facility Prior Approval Number:    Date Approved/Denied:   PASRR Number:   [id number]  Discharge Plan: SNF    Current Diagnoses: Patient Active Problem List   Diagnosis Date Noted  . Essential hypertension   . Atrial fibrillation (HCC)   . History of CVA with residual deficit   . Acute blood loss anemia   . Malignant hypertension   . Hemorrhagic stroke (HCC) 05/27/2015  . Intracerebral bleed (HCC) 05/27/2015  . Total knee replacement status 12/24/2014    Orientation RESPIRATION BLADDER Height & Weight    Self, Time, Situation, Place  Normal Continent 6\' 2"  (188 cm) 213 lbs.  BEHAVIORAL SYMPTOMS/MOOD NEUROLOGICAL BOWEL NUTRITION STATUS   (NONE )  (NONE) Continent Diet (CARB MODIFIED )  AMBULATORY STATUS COMMUNICATION OF NEEDS Skin   Limited Assist Verbally Surgical wounds (Closed )                       Personal Care Assistance Level of Assistance  Bathing, Dressing Bathing Assistance: Limited assistance   Dressing Assistance: Limited assistance     Functional Limitations Info             SPECIAL CARE FACTORS FREQUENCY  PT (By licensed PT), OT (By licensed OT)     PT Frequency: 3 OT Frequency: 3            Contractures      Additional Factors Info  Allergies, Code Status Code Status Info: FULL CODE  Allergies Info: Penicillins, Blood-group Specific Substance           Current Medications (06/05/2015):  This  is the current hospital active medication list Current Facility-Administered Medications  Medication Dose Route Frequency Provider Last Rate Last Dose  . acetaminophen-codeine (TYLENOL #3) 300-30 MG per tablet 1 tablet  1 tablet Oral Q6H PRN Micki Riley, MD   1 tablet at 06/04/15 2841  . bisacodyl (DULCOLAX) suppository 10 mg  10 mg Rectal Daily PRN [name], MD      . butalbital-acetaminophen-caffeine (FIORICET, ESGIC) [phone] MG per tablet 1 tablet  1 tablet Oral Q8H PRN Marvel Plan, MD   1 tablet at 06/04/15 0359  . diltiazem (DILACOR XR) 24 hr capsule 120 mg  120 mg Oral Daily Marvel Plan, MD   120 mg at 06/04/15 1245  . docusate sodium (COLACE) capsule 100 mg  100 mg Oral BID [name], MD   100 mg at 06/04/15 2210  . feeding supplement (GLUCERNA SHAKE) (GLUCERNA SHAKE) liquid 237 mL  237 mL Oral TID BM [name], RD   237 mL at 06/04/15 1723  . gabapentin (NEURONTIN) capsule 600 mg  600 mg Oral QID Marvel Plan, MD   600 mg at 06/04/15 2213  . glipiZIDE (GLUCOTROL) tablet 10 mg  10 mg Oral QAC breakfast Marvel Plan, MD   10 mg at 06/04/15 0909  . HYDROcodone-acetaminophen (NORCO) 10-325 MG per tablet  1-2 tablet  1-2 tablet Oral Q6H PRN [name], MD   1 tablet at 06/05/15 0101  . HYDROmorphone (DILAUDID) injection 1 mg  1 mg Intravenous Q4H PRN [name], MD   1 mg at 06/01/15 2143  . indomethacin (INDOCIN) capsule 50 mg  50 mg Oral BID WC Barnett AbuHenry Elsner, MD   50 mg at 06/04/15 1722  . insulin aspart (novoLOG) injection 0-24 Units  0-24 Units Subcutaneous TID AC & HS [name], MD   4 Units at 06/03/15 2238  . labetalol (NORMODYNE,TRANDATE) injection 10-40 mg  10-40 mg Intravenous Q10 min PRN Barnett AbuHenry Elsner, MD   20 mg at 06/01/15 1632  . levETIRAcetam (KEPPRA) tablet 500 mg  500 mg Oral BID Barnett AbuHenry Elsner, MD   500 mg at 06/04/15 2209  . lisinopril (PRINIVIL,ZESTRIL) tablet 20 mg  20 mg Oral BID [name], MD   20 mg at 06/04/15 2209  . metFORMIN (GLUCOPHAGE)  tablet 850 mg  850 mg Oral BID WC [name], MD   850 mg at 06/04/15 1723  . metoprolol (LOPRESSOR) injection 2.5-5 mg  2.5-5 mg Intravenous Q5 min PRN [name], MD   5 mg at 05/28/15 0414  . metoprolol tartrate (LOPRESSOR) tablet 75 mg  75 mg Oral BID [name], MD   75 mg at 06/04/15 2209  . ondansetron (ZOFRAN) tablet 4 mg  4 mg Oral Q4H PRN Barnett AbuHenry Elsner, MD   4 mg at 06/02/15 0558   Or  . ondansetron [facility](ZOFRAN) injection 4 mg  4 mg Intravenous Q4H PRN Barnett AbuHenry Elsner, MD   4 mg at 06/01/15 2041  . pantoprazole (PROTONIX) EC tablet 40 mg  40 mg Oral QHS [name], RPH   40 mg at 06/04/15 2213  . polyethylene glycol (MIRALAX / GLYCOLAX) packet 17 g  17 g Oral Daily PRN Barnett AbuHenry Elsner, MD      . promethazine (PHENERGAN) tablet 12.5-25 mg  12.5-25 mg Oral Q4H PRN Barnett AbuHenry Elsner, MD      . Gwyndolyn Kaufmansenna [facility](SENOKOT) tablet 8.6 mg  1 tablet Oral BID Barnett AbuHenry Elsner, MD   8.6 mg at 06/04/15 2209     Discharge Medications: Please see discharge summary for a list of discharge medications.  Relevant Imaging Results:  Relevant Lab Results:   Additional Information    [name], [name], LCSW    I have personally examined this patient, reviewed notes, independently viewed imaging studies, participated in medical decision making and plan of care. . Agree with note above.   Delia HeadyPramod Sethi, MD Medical Director [facility] Pager: [phone] 06/05/2015 1:27 PM

## 2015-06-05 NOTE — Progress Notes (Signed)
Patient ID: [name], male   DOB: 09-29-1955, 60 y.o.   MRN: [id number] Vital signs are stable Motor function appears good Patient complaining of left ankle pain still X-ray showed only degenerative changes May need formal orthopedic consultation for further evaluation Of the furthest to neurology Patient appropriate for CIR

## 2015-06-05 NOTE — Progress Notes (Signed)
STROKE TEAM PROGRESS NOTE  HISTORY DOCUMENTED AT TIME OF ADMISSION  [name] is an 60 y.o. 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 He was started at discharge from the hospital 3 days after the stroke onset, on Eliquis 5 mg twice a day. He has been compliant with the medication, took his last dose this morning at about 8 AM. He is also on aspirin 325 mg daily for the past few years and continues to take aspirin now, along with Eliquis. 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 This morning he woke up with right-sided headache, nausea and generalized fatigue but no other focal neurological symptoms. He was advised to go to the ER by his wife for further evaluation. Initial CT of the head showed a large right parieto-occipital parenchymal hemorrhage. Neurosurgery evaluated the patient in the ER and at this time no surgical intervention is planned. His blood pressure was elevated to 170s systolic has been started on nicardipine drip to maintain goal systolic blood pressure less than 130 to reduce the risk of hemorrhagic expansion. He was also started on Kcentra, after consultation with pharmacy.    SUBJECTIVE (INTERVAL HISTORY) Patient is awake and alert.    OOB in chair today.   No complaints. Left ankle   pain persists but it is mainly on weightbearing. He denies history of fall, trauma or injury. He has no history of gout or arthritis OBJECTIVE Temp:  [97.7 F (36.5 C)-99.1 F (37.3 C)] 98.3 F (36.8 C) (01/11 1432) Pulse Rate:  [77-106] 88 (01/11 1432) Cardiac Rhythm:  [-] Atrial fibrillation (01/11 0759) Resp:  [18-20] 18 (01/11 1432) BP: (113-139)/(72-90) 124/90 mmHg (01/11 1432) SpO2:  [100 %] 100 % (01/11 1432)  CBC:   Recent Labs Lab 06/02/15 0600 06/03/15 0403  WBC 8.6 7.9  HGB 10.8* 10.3*  HCT 34.1* 32.4*  MCV 78.6 78.1   PLT 203 199    Basic Metabolic Panel:  Recent Labs Lab 06/01/15 0500  06/02/15 1731 06/03/15 0927  NA 140  < > 138 139  K 3.8  --   --  3.5  CL 105  --   --  101  CO2 25  --   --  26  GLUCOSE 196*  --   --  159*  BUN <5*  --   --  5*  CREATININE 0.79  --   --  0.84  CALCIUM 8.5*  --   --  8.9  < > = values in this interval not displayed.  Lipid Panel:     Component Value Date/Time   CHOL 111 05/29/2015 0510   TRIG 72 05/29/2015 0510   HDL 35* 05/29/2015 0510   CHOLHDL 3.2 05/29/2015 0510   VLDL 14 05/29/2015 0510   LDLCALC 62 05/29/2015 0510   HgbA1c:  Lab Results  Component Value Date   HGBA1C 7.6* 05/29/2015   Urine Drug Screen:     Component Value Date/Time   LABOPIA POSITIVE* 05/27/2015 1944   COCAINSCRNUR NONE DETECTED 05/27/2015 1944   LABBENZ NONE DETECTED 05/27/2015 1944   AMPHETMU NONE DETECTED 05/27/2015 1944   THCU NONE DETECTED 05/27/2015 1944   LABBARB NONE DETECTED 05/27/2015 1944      IMAGING   Ct Angio Head and Neck W/cm &/or Wo Cm  05/27/2015   Aborted study due to extravasation of IV contrast into the arm.  Ct Head Wo Contrast 05/27/2015   Right parietal/occipital lobe hemorrhage, likely due to hemorrhagic transformation of a right PCA distribution infarct. Mass effect with extension of hemorrhage into the right lateral ventricle and probable developing hydrocephal  05/28/2015 IMPRESSION: 1. Size stable parenchymal hemorrhage in the posterior right cerebral hemisphere, again suggestive of hemorrhagic PCA territory infarct. Mass effect with 8mm midline shift is stable. 2. Stable intraventricular extension with right temporal horn obstruction. 3. Correlation with reported outside imaging would be useful. Otherwise, surveillance is recommended.   05/30/2015  IMPRESSION: 1. Large right parieto-occipital intraparenchymal hemorrhage has increased mildly in size, measuring 8.4 x 5.0 cm, with mildly worsened surrounding edema. The degree of leftward  midline shift is relatively stable, measuring 7-8 mm. 2. Mild dilatation of the lateral ventricles in comparison to the prior study, concerning for mild obstructive hydrocephalus, with new blood noted filling the third ventricle. 3. Mild mass effect about the right cerebral peduncle, without evidence for transtentorial herniation at this time.     CT Head Wo Contrast 05/31/2015 Hemorrhagic right PCA infarct with intraventricular extension. The volume of hemorrhage and vasogenic edema is stable from yesterday but there is progressive lateral ventriculomegaly with increased leftward midline shift, now 13 mm.   CT Head Wo Contrast 06/01/2015 1. Hemorrhagic right PCA infarct status post interval hematoma evacuation. Residual hemorrhage as above. 2. Decreased midline shift, now 6 mm. 3. Decreased size of the ventricles.   TTE - - Left ventricle: The cavity size was normal. Systolic function was normal. The estimated ejection fraction was in the range of 60% to 65%. Wall motion was normal; there were no regional wall motion abnormalities. The study was not technically sufficient to allow evaluation of LV diastolic dysfunction due to atrial fibrillation. - Aortic valve: There was no regurgitation. - Aortic root: The aortic root was normal in size. - Mitral valve: Structurally normal valve. There was mild regurgitation. - Left atrium: The atrium was mildly dilated. - Right ventricle: The cavity size was normal. Wall thickness was normal. Systolic function was normal. - Right atrium: The atrium was normal in size. - Tricuspid valve: There was mild regurgitation. - Pulmonic valve: There was no regurgitation. - Pulmonary arteries: Systolic pressure was within the normal range. - Inferior vena cava: The vessel was normal in size. - Pericardium, extracardiac: There was no pericardial effusion.     PHYSICAL EXAM  Temp:  [97.7 F (36.5 C)-99.1 F (37.3 C)] 98.3 F (36.8 C)  (01/11 1432) Pulse Rate:  [77-106] 88 (01/11 1432) Resp:  [18-20] 18 (01/11 1432) BP: (113-139)/(72-90) 124/90 mmHg (01/11 1432) SpO2:  [100 %] 100 % (01/11 1432)  General - Well nourished, well developed, in no acute distress. HEENT:  Drain in place; moist mucous membranes; nose and throat clear Cardiovascular - Regular rate and rhythm. Abd:  Benign Extr:  No C/C/E.Marland Kitchen Left ankle slightly swollen but no increase in temperature or point tenderness  Mental Status -  Level of arousal and orientation to time, place, and person were intact. Language including expression, naming, repetition, comprehension was assessed and found intact. Attention span and concentration were normal. Fund of Knowledge was assessed and was intact.  Cranial Nerves II - XII - II - left homonymous hemianopia. III, IV, VI - Extraocular movements intact. V - Facial sensation intact bilaterally. VII - Facial movement intact bilaterally.mild left nasolabial asymmetry VIII - Hearing intact bilaterally. X - Palate elevates symmetrically. XI - Chin turning & shoulder shrug intact bilaterally. XII - Tongue protrusion  intact.  Motor Strength - The patient's strength was normal in all extremities and pronator drift was absent.  Bulk was normal and fasciculations were absent.   Motor Tone - Muscle tone was assessed at the neck and appendages and was normal.  Sensory - Light touch, temperature/pinprick were assessed and were symmetrical.    Coordination - The patient had normal movements in the hands with no ataxia or dysmetria.    Gait and Station - not tested.   ASSESSMENT/PLAN [name] is a 60 y.o. 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  He did not receive IV t-PA due to hemorrhagic stroke.  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 May consider resume eliquis in about 4-6 weeks when following up in clinic depending on clinic picture.  Ongoing aggressive stroke risk factor management  Therapy recommendations: SNF Disposition: SNF Cerebral edema  Midline shift stable on CT head  Now off 3% saline  Na 139 on 06/03/2015  Recent right PCA infarct  With left hemianopia  Likely due to afib on on AC  Afib with RVR on eliquis PTA  Rate 90-115  Put back on home metoprolol and cardizem  Decrease cardizem to 120 due to bradycardia  Hold off eliquis due to ICH  Discontinue ASA as home medication  Hypertension  Malignant hypertension  Off cardene  Put back on home medications.  Now on:  Metoprolol 75mg  BID  Diltiazem XR 120mg  daily  Lisinopril 20mg  daily.  Will change to BID  Hyperlipidemia  Home meds:  No lipid lowering medications prior to admission  LDL 62, goal < 70  Continue statin at discharge  Diabetes  HgbA1c 7.6, goal < 7.0  SSI  Resume home meds with metformin and glipizide  CBG monitoring  Other Stroke Risk Factors  Advanced age  ETOH use  Hx stroke/TIA  Obstructive sleep apnea, on CPAP at home  Other Active Problems  HA - fioricet PRN  Nausea - zofran PRN  Anemia  Left ankle pain/swelling. Unclear etiology. We'll evaluate  for gout  Hospital day # 9  VASCULAR NEUROLOGY ATTENDING NOTE Patient was seen and examined by me personally. I reviewed notes, independently viewed imaging studies, participated in medical decision making and plan of care. The laboratory and radiographic studies were personally reviewed by me.  ROS completed by me personally and pertinent positives fully documented.  Assessment and plan completed by me personally: Continue mobilization out of bed. Physical occupational speech therapy recommend SNFl . Patient will likely need to be off anticoagulation for a month at least but will start aspirin in 1 week..  Presumptive treatment for gout to see if it helps. Check uric acid. Await nursing home [facility]     SIGNED BY: Dr. Delia Heady, MD

## 2015-06-05 NOTE — Clinical Social Work Note (Signed)
Clinical Social Work Assessment  Patient Details  Name: [name] MRN: [id number] Date of Birth: 14-Dec-1955  Date of referral:  06/05/15               Reason for consult:  Facility Placement, Discharge Planning                Permission sought to share information with:  Family Supports, Case Freight forwarder, Chartered certified accountant granted to share information::  Yes, Verbal Permission Granted  Name::      (Antoniette Priest)  Agency::   (SNF's )  Relationship::   (Spouse )  Contact Information:     Housing/Transportation Living arrangements for the past 2 months:  Single Family Home Source of Information:  Patient, Adult Children, Spouse Patient Interpreter Needed:  None Criminal Activity/Legal Involvement Pertinent to Current Situation/Hospitalization:  No - Comment as needed Significant Relationships:  Spouse, Adult Children Lives with:  Spouse Do you feel safe going back to the place where you live?  Yes Need for family participation in patient care:  Yes (Comment)  Care giving concerns:  Patient requiring short-term placement at SNF. Patient initially decided on returning home with home health services. RNCM and MD was notified. After discussion with MD, patient is now willing to transition to SNF level of care.    Social Worker assessment / plan: [place] representative met with patient and pt's family in reference to post- acute placement. CSW introduced CSW role and SNF process. CSW reviewed and provided SNF list as well as [facility] contracted facility list. Pt and family would prefer that pt remains in [place] are however understands that pt is limited due to amount of SNF's with [place] contracts. Family aware that SNF's in [place] do NOT have contracts with [facility]. No further concerns reported at this time. CSW will continue to follow pt and pt's family for continued support and to facilitate pt's discharge needs once medically stable.   Employment status:   Retired Forensic scientist:  [facility] Benefit PT Recommendations:  [place] / Referral to community resources:  [place]  Patient/Family's Response to care:  Pt a/o x4. Pt now agreeable to SNF placement. Family also agreeable. Family supportive and involved in pt's care. Pt and family polite, pleasant and appreciated social work intervention.  Patient/Family's Understanding of and Emotional Response to Diagnosis, Current Treatment, and Prognosis:  Pt and family knowledgeable of medical interventions and on-going treatment.   Emotional Assessment Appearance:  Appears stated age Attitude/Demeanor/Rapport:   (Pleasant ) Affect (typically observed):  Accepting, Appropriate, Pleasant Orientation:  Oriented to Self, Oriented to Place, Oriented to  Time, Oriented to Situation Alcohol / Substance use:  Not Applicable Psych involvement (Current and /or in the community):  No (Comment)  Discharge Needs  Concerns to be addressed:  Care Coordination Readmission within the last 30 days:  No Current discharge risk:  Dependent with Mobility Barriers to Discharge:  Insurance Authorization, No SNF bed   Glendon Axe, MSW, LCSWA [phone] 06/05/2015 10:07 PM

## 2015-06-05 NOTE — Clinical Social Work Note (Signed)
Clinical Social Worker has assessed patient at bedside earlier this morning. Initially patient was NOT agreeable to SNF placement. After discussion with MD, patient is now currently agreeable to SNF placement. Patient currently does NOT have any bed offers.   [facility] no longer has contract with [facility]. CSW has contacted [facility] social worker and left a message in regards to facilities contracted with [facility]. Patient informed of findings and will await bed offers for SNF. RNCM notified.   CSW remains available as needed.  Derenda FennelBashira Brodi Kari, MSW, LCSWA [phone] 06/05/2015 12:17 PM

## 2015-06-05 NOTE — Clinical Social Work Placement (Signed)
   CLINICAL SOCIAL WORK PLACEMENT  NOTE  Date:  06/05/2015  Patient Details  Name: [name] MRN: [id number] Date of Birth: 06/05/1955  Clinical Social Work is seeking post-discharge placement for this patient at the Skilled  Nursing Facility level of care (*CSW will initial, date and re-position this form in  chart as items are completed):  Yes   Patient/family provided with [facility] Clinical Social Work Department's list of facilities offering this level of care within the geographic area requested by the patient (or if unable, by the patient's family).  Yes   Patient/family informed of their freedom to choose among providers that offer the needed level of care, that participate in Medicare, Medicaid or managed care program needed by the patient, have an available bed and are willing to accept the patient.  Yes   Patient/family informed of [facility]'s ownership interest [facility] and [facility], as well as of the fact that they are under no obligation to receive care at these facilities.  PASRR submitted to EDS on 06/05/15     PASRR number received on 06/05/15     Existing PASRR number confirmed on       FL2 transmitted to all facilities in geographic area requested by pt/family on 06/05/15     FL2 transmitted to all facilities within larger geographic area on 06/05/15     Patient informed that his/her managed care company has contracts with or will negotiate with certain facilities, including the following:            Patient/family informed of bed offers received.  Patient chooses bed at       Physician recommends and patient chooses bed at      Patient to be transferred to   on  .  Patient to be transferred to facility by       Patient family notified on   of transfer.  Name of family member notified:        PHYSICIAN Please sign FL2     Additional Comment:    _______________________________________________ Vaughan BrownerNixon, Sheyli Horwitz A,  LCSW 06/05/2015, 10:08 PM

## 2015-06-06 LAB — GLUCOSE, CAPILLARY
GLUCOSE-CAPILLARY: 137 mg/dL — AB (ref 65–99)
GLUCOSE-CAPILLARY: 98 mg/dL (ref 65–99)
Glucose-Capillary: 166 mg/dL — ABNORMAL HIGH (ref 65–99)
Glucose-Capillary: 69 mg/dL (ref 65–99)
Glucose-Capillary: 178 mg/dL — ABNORMAL HIGH (ref 65–99)
Glucose-Capillary: 218 mg/dL — ABNORMAL HIGH (ref 65–99)

## 2015-06-06 NOTE — Progress Notes (Signed)
   06/06/15 0027  BiPAP/CPAP/SIPAP  BiPAP/CPAP/SIPAP Pt Type Adult  Mask Type Full face mask  Mask Size Large  Respiratory Rate 18 breaths/min  IPAP 9.5 cmH20  EPAP 9.5 cmH2O  Oxygen Percent 21 %  Flow Rate 0 lpm  BiPAP/CPAP/SIPAP CPAP  Patient Home Equipment No  Auto Titrate No

## 2015-06-06 NOTE — Progress Notes (Signed)
[facility]- await callback for update on consideration/eligibilty and bed availability.   Reece LevyJanet Lavonna Lampron, MSW, Theresia MajorsLCSWA  [phone]

## 2015-06-06 NOTE — Progress Notes (Signed)
STROKE TEAM PROGRESS NOTE  HISTORY DOCUMENTED AT TIME OF ADMISSION  [name] is an 60 y.o. 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 He was started at discharge from the hospital 3 days after the stroke onset, on Eliquis 5 mg twice a day. He has been compliant with the medication, took his last dose this morning at about 8 AM. He is also on aspirin 325 mg daily for the past few years and continues to take aspirin now, along with Eliquis. 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 This morning he woke up with right-sided headache, nausea and generalized fatigue but no other focal neurological symptoms. He was advised to go to the ER by his wife for further evaluation. Initial CT of the head showed a large right parieto-occipital parenchymal hemorrhage. Neurosurgery evaluated the patient in the ER and at this time no surgical intervention is planned. His blood pressure was elevated to 170s systolic has been started on nicardipine drip to maintain goal systolic blood pressure less than 130 to reduce the risk of hemorrhagic expansion. He was also started on Kcentra, after consultation with pharmacy.    SUBJECTIVE (INTERVAL HISTORY) Patient is awake and alert.     .   No complaints. States Left ankle   swelling and pain are diminished.[facility]wait [facility] OBJECTIVE Temp:  [97.7 F (36.5 C)-98.4 F (36.9 C)] 97.9 F (36.6 C) (01/12 1014) Pulse Rate:  [78-100] 100 (01/12 1014) Cardiac Rhythm:  [-] Atrial fibrillation (01/12 0700) Resp:  [18] 18 (01/12 1014) BP: (124-147)/(79-90) 147/90 mmHg (01/12 1014) SpO2:  [99 %-100 %] 100 % (01/12 1014)  CBC:   Recent Labs Lab 06/02/15 0600 06/03/15 0403  WBC 8.6 7.9  HGB 10.8* 10.3*  HCT 34.1* 32.4*  MCV 78.6 78.1  PLT 203 199    Basic Metabolic Panel:  Recent Labs Lab 06/01/15 0500   06/02/15 1731 06/03/15 0927  NA 140  < > 138 139  K 3.8  --   --  3.5  CL 105  --   --  101  CO2 25  --   --  26  GLUCOSE 196*  --   --  159*  BUN <5*  --   --  5*  CREATININE 0.79  --   --  0.84  CALCIUM 8.5*  --   --  8.9  < > = values in this interval not displayed.  Lipid Panel:     Component Value Date/Time   CHOL 111 05/29/2015 0510   TRIG 72 05/29/2015 0510   HDL 35* 05/29/2015 0510   CHOLHDL 3.2 05/29/2015 0510   VLDL 14 05/29/2015 0510   LDLCALC 62 05/29/2015 0510   HgbA1c:  Lab Results  Component Value Date   HGBA1C 7.6* 05/29/2015   Urine Drug Screen:     Component Value Date/Time   LABOPIA POSITIVE* 05/27/2015 1944   COCAINSCRNUR NONE DETECTED 05/27/2015 1944   LABBENZ NONE DETECTED 05/27/2015 1944   AMPHETMU NONE DETECTED 05/27/2015 1944   THCU NONE DETECTED 05/27/2015 1944   LABBARB NONE DETECTED 05/27/2015 1944      IMAGING   Ct Angio Head and Neck W/cm &/or Wo Cm  05/27/2015   Aborted study due to extravasation of IV contrast into the arm.   Ct Head Wo Contrast 05/27/2015   Right parietal/occipital lobe hemorrhage, likely  due to hemorrhagic transformation of a right PCA distribution infarct. Mass effect with extension of hemorrhage into the right lateral ventricle and probable developing hydrocephal  05/28/2015 IMPRESSION: 1. Size stable parenchymal hemorrhage in the posterior right cerebral hemisphere, again suggestive of hemorrhagic PCA territory infarct. Mass effect with 8mm midline shift is stable. 2. Stable intraventricular extension with right temporal horn obstruction. 3. Correlation with reported outside imaging would be useful. Otherwise, surveillance is recommended.   05/30/2015  IMPRESSION: 1. Large right parieto-occipital intraparenchymal hemorrhage has increased mildly in size, measuring 8.4 x 5.0 cm, with mildly worsened surrounding edema. The degree of leftward midline shift is relatively stable, measuring 7-8 mm. 2. Mild dilatation of the  lateral ventricles in comparison to the prior study, concerning for mild obstructive hydrocephalus, with new blood noted filling the third ventricle. 3. Mild mass effect about the right cerebral peduncle, without evidence for transtentorial herniation at this time.     CT Head Wo Contrast 05/31/2015 Hemorrhagic right PCA infarct with intraventricular extension. The volume of hemorrhage and vasogenic edema is stable from yesterday but there is progressive lateral ventriculomegaly with increased leftward midline shift, now 13 mm.   CT Head Wo Contrast 06/01/2015 1. Hemorrhagic right PCA infarct status post interval hematoma evacuation. Residual hemorrhage as above. 2. Decreased midline shift, now 6 mm. 3. Decreased size of the ventricles.   TTE - - Left ventricle: The cavity size was normal. Systolic function was normal. The estimated ejection fraction was in the range of 60% to 65%. Wall motion was normal; there were no regional wall motion abnormalities. The study was not technically sufficient to allow evaluation of LV diastolic dysfunction due to atrial fibrillation. - Aortic valve: There was no regurgitation. - Aortic root: The aortic root was normal in size. - Mitral valve: Structurally normal valve. There was mild regurgitation. - Left atrium: The atrium was mildly dilated. - Right ventricle: The cavity size was normal. Wall thickness was normal. Systolic function was normal. - Right atrium: The atrium was normal in size. - Tricuspid valve: There was mild regurgitation. - Pulmonic valve: There was no regurgitation. - Pulmonary arteries: Systolic pressure was within the normal range. - Inferior vena cava: The vessel was normal in size. - Pericardium, extracardiac: There was no pericardial effusion.     PHYSICAL EXAM  Temp:  [97.7 F (36.5 C)-98.4 F (36.9 C)] 97.9 F (36.6 C) (01/12 1014) Pulse Rate:  [78-100] 100 (01/12 1014) Resp:  [18] 18 (01/12  1014) BP: (124-147)/(79-90) 147/90 mmHg (01/12 1014) SpO2:  [99 %-100 %] 100 % (01/12 1014)  General - Well nourished, well developed, in no acute distress. HEENT:   ; moist mucous membranes; nose and throat clear Cardiovascular - Regular rate and rhythm. Abd:  Benign Extr:  No  edema  Mental Status -  Level of arousal and orientation to time, place, and person were intact. Language including expression, naming, repetition, comprehension was assessed and found intact. Attention span and concentration were normal. Fund of Knowledge was assessed and was intact.  Cranial Nerves II - XII - II - left homonymous hemianopia. III, IV, VI - Extraocular movements intact. V - Facial sensation intact bilaterally. VII - Facial movement intact bilaterally.mild left nasolabial asymmetry VIII - Hearing intact bilaterally. X - Palate elevates symmetrically. XI - Chin turning & shoulder shrug intact bilaterally. XII - Tongue protrusion intact.  Motor Strength - The patient's strength was normal in all extremities and pronator drift was absent.  Bulk was normal and  fasciculations were absent.   Motor Tone - Muscle tone was assessed at the neck and appendages and was normal.  Sensory - Light touch, temperature/pinprick were assessed and were symmetrical.    Coordination - The patient had normal movements in the hands with no ataxia or dysmetria.    Gait and Station - not tested.   ASSESSMENT/PLAN [name] is a 60 y.o. 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  He did not receive IV t-PA due to hemorrhagic stroke.  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 May consider resume eliquis in about 4-6 weeks when following up in clinic depending on clinic picture.  Ongoing aggressive stroke risk factor management  Therapy recommendations: SNF Disposition: SNF Cerebral edema  Midline shift stable on CT head  Now off 3% saline  Na 139 on 06/03/2015  Recent right PCA infarct  With left hemianopia  Likely due to afib on on AC  Afib with RVR on eliquis PTA  Rate 90-115  Put back on home metoprolol and cardizem  Decrease cardizem to 120 due to bradycardia  Hold off eliquis due to ICH  Discontinue ASA as home medication  Hypertension  Malignant hypertension  Off cardene  Put back on home medications.  Now on:  Metoprolol 75mg  BID  Diltiazem XR 120mg  daily  Lisinopril 20mg  daily.  Will change to BID  Hyperlipidemia  Home meds:  No lipid lowering medications prior to admission  LDL 62, goal < 70  Continue statin at discharge  Diabetes  HgbA1c 7.6, goal < 7.0  SSI  Resume home meds with metformin and glipizide  CBG monitoring  Other Stroke Risk Factors  Advanced age  ETOH use  Hx stroke/TIA  Obstructive sleep apnea, on CPAP at home  Other Active Problems  HA - fioricet PRN  Nausea - zofran PRN  Anemia  Hospital day # 10  VASCULAR NEUROLOGY ATTENDING NOTE Patient was seen and examined by me personally. I reviewed notes, independently viewed imaging studies, participated in medical decision making and plan of  care. The laboratory and radiographic studies were personally reviewed by me.  ROS completed by me personally and pertinent positives fully documented.  Assessment and plan completed by me personally: Continue mobilization out of bed. Physical occupational speech therapy recommend SNF. Patient will likely need to be off anticoagulation for a month at least but will start aspirin in 1 week..  Condition is improved       SIGNED BY: Dr. Delia Heady, MD

## 2015-06-06 NOTE — Progress Notes (Signed)
RT note: Pt. placed on CPAP for h/s, humidity filled, on room air, tolerating well, RT to monitor.

## 2015-06-07 LAB — GLUCOSE, CAPILLARY
GLUCOSE-CAPILLARY: 89 mg/dL (ref 65–99)
GLUCOSE-CAPILLARY: 193 mg/dL — AB (ref 65–99)
GLUCOSE-CAPILLARY: 156 mg/dL — AB (ref 65–99)
Glucose-Capillary: 85 mg/dL (ref 65–99)

## 2015-06-07 NOTE — Progress Notes (Signed)
STROKE TEAM PROGRESS NOTE  HISTORY DOCUMENTED AT TIME OF ADMISSION  BERNHARD KOSKINEN is an 60 y.o. 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 He was started at discharge from the hospital 3 days after the stroke onset, on Eliquis 5 mg twice a day. He has been compliant with the medication, took his last dose this morning at about 8 AM. He is also on aspirin 325 mg daily for the past few years and continues to take aspirin now, along with Eliquis. 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 This morning he woke up with right-sided headache, nausea and generalized fatigue but no other focal neurological symptoms. He was advised to go to the ER by his wife for further evaluation. Initial CT of the head showed a large right parieto-occipital parenchymal hemorrhage. Neurosurgery evaluated the patient in the ER and at this time no surgical intervention is planned. His blood pressure was elevated to 170s systolic has been started on nicardipine drip to maintain goal systolic blood pressure less than 130 to reduce the risk of hemorrhagic expansion. He was also started on Kcentra, after consultation with pharmacy.    SUBJECTIVE (INTERVAL HISTORY) Patient is awake and alert.     .   No complaints. States Left ankle   swelling and pain are diminished.[facility]wait [facility] for snf rehab OBJECTIVE Temp:  [98 F (36.7 C)-99.6 F (37.6 C)] 98.3 F (36.8 C) (01/13 1354) Pulse Rate:  [72-95] 79 (01/13 1354) Cardiac Rhythm:  [-] Atrial fibrillation (01/13 0700) Resp:  [18] 18 (01/13 1354) BP: (133-145)/(79-97) 145/87 mmHg (01/13 1354) SpO2:  [100 %] 100 % (01/13 1354)  CBC:   Recent Labs Lab 06/02/15 0600 06/03/15 0403  WBC 8.6 7.9  HGB 10.8* 10.3*  HCT 34.1* 32.4*  MCV 78.6 78.1  PLT 203 199    Basic Metabolic Panel:  Recent Labs Lab 06/01/15 0500   06/02/15 1731 06/03/15 0927  NA 140  < > 138 139  K 3.8  --   --  3.5  CL 105  --   --  101  CO2 25  --   --  26  GLUCOSE 196*  --   --  159*  BUN <5*  --   --  5*  CREATININE 0.79  --   --  0.84  CALCIUM 8.5*  --   --  8.9  < > = values in this interval not displayed.  Lipid Panel:     Component Value Date/Time   CHOL 111 05/29/2015 0510   TRIG 72 05/29/2015 0510   HDL 35* 05/29/2015 0510   CHOLHDL 3.2 05/29/2015 0510   VLDL 14 05/29/2015 0510   LDLCALC 62 05/29/2015 0510   HgbA1c:  Lab Results  Component Value Date   HGBA1C 7.6* 05/29/2015   Urine Drug Screen:     Component Value Date/Time   LABOPIA POSITIVE* 05/27/2015 1944   COCAINSCRNUR NONE DETECTED 05/27/2015 1944   LABBENZ NONE DETECTED 05/27/2015 1944   AMPHETMU NONE DETECTED 05/27/2015 1944   THCU NONE DETECTED 05/27/2015 1944   LABBARB NONE DETECTED 05/27/2015 1944      IMAGING   Ct Angio Head and Neck W/cm &/or Wo Cm  05/27/2015   Aborted study due to extravasation of IV contrast into the arm.   Ct Head Wo Contrast 05/27/2015   Right parietal/occipital lobe hemorrhage, likely due  to hemorrhagic transformation of a right PCA distribution infarct. Mass effect with extension of hemorrhage into the right lateral ventricle and probable developing hydrocephal  05/28/2015 IMPRESSION: 1. Size stable parenchymal hemorrhage in the posterior right cerebral hemisphere, again suggestive of hemorrhagic PCA territory infarct. Mass effect with 8mm midline shift is stable. 2. Stable intraventricular extension with right temporal horn obstruction. 3. Correlation with reported outside imaging would be useful. Otherwise, surveillance is recommended.   05/30/2015  IMPRESSION: 1. Large right parieto-occipital intraparenchymal hemorrhage has increased mildly in size, measuring 8.4 x 5.0 cm, with mildly worsened surrounding edema. The degree of leftward midline shift is relatively stable, measuring 7-8 mm. 2. Mild dilatation of the  lateral ventricles in comparison to the prior study, concerning for mild obstructive hydrocephalus, with new blood noted filling the third ventricle. 3. Mild mass effect about the right cerebral peduncle, without evidence for transtentorial herniation at this time.     CT Head Wo Contrast 05/31/2015 Hemorrhagic right PCA infarct with intraventricular extension. The volume of hemorrhage and vasogenic edema is stable from yesterday but there is progressive lateral ventriculomegaly with increased leftward midline shift, now 13 mm.   CT Head Wo Contrast 06/01/2015 1. Hemorrhagic right PCA infarct status post interval hematoma evacuation. Residual hemorrhage as above. 2. Decreased midline shift, now 6 mm. 3. Decreased size of the ventricles.   TTE - - Left ventricle: The cavity size was normal. Systolic function was normal. The estimated ejection fraction was in the range of 60% to 65%. Wall motion was normal; there were no regional wall motion abnormalities. The study was not technically sufficient to allow evaluation of LV diastolic dysfunction due to atrial fibrillation. - Aortic valve: There was no regurgitation. - Aortic root: The aortic root was normal in size. - Mitral valve: Structurally normal valve. There was mild regurgitation. - Left atrium: The atrium was mildly dilated. - Right ventricle: The cavity size was normal. Wall thickness was normal. Systolic function was normal. - Right atrium: The atrium was normal in size. - Tricuspid valve: There was mild regurgitation. - Pulmonic valve: There was no regurgitation. - Pulmonary arteries: Systolic pressure was within the normal range. - Inferior vena cava: The vessel was normal in size. - Pericardium, extracardiac: There was no pericardial effusion.     PHYSICAL EXAM  Temp:  [98 F (36.7 C)-99.6 F (37.6 C)] 98.3 F (36.8 C) (01/13 1354) Pulse Rate:  [72-95] 79 (01/13 1354) Resp:  [18] 18 (01/13  1354) BP: (133-145)/(79-97) 145/87 mmHg (01/13 1354) SpO2:  [100 %] 100 % (01/13 1354)  General - Well nourished, well developed, in no acute distress. HEENT:   ; moist mucous membranes; nose and throat clear Cardiovascular - Regular rate and rhythm. Abd:  Benign Extr:  No  edema  Mental Status -  Level of arousal and orientation to time, place, and person were intact. Language including expression, naming, repetition, comprehension was assessed and found intact. Attention span and concentration were normal. Fund of Knowledge was assessed and was intact.  Cranial Nerves II - XII - II - left homonymous hemianopia. III, IV, VI - Extraocular movements intact. V - Facial sensation intact bilaterally. VII - Facial movement intact bilaterally.mild left nasolabial asymmetry VIII - Hearing intact bilaterally. X - Palate elevates symmetrically. XI - Chin turning & shoulder shrug intact bilaterally. XII - Tongue protrusion intact.  Motor Strength - The patient's strength was normal in all extremities and pronator drift was absent.  Bulk was normal and fasciculations were  absent.   Motor Tone - Muscle tone was assessed at the neck and appendages and was normal.  Sensory - Light touch, temperature/pinprick were assessed and were symmetrical.    Coordination - The patient had normal movements in the hands with no ataxia or dysmetria.    Gait and Station - not tested.   ASSESSMENT/PLAN Mr. Geroge BasemanOtis C Ranes is a 60 y.o. 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  He did not receive IV t-PA due to hemorrhagic stroke.  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 May consider resume eliquis in about 4-6 weeks when following up in clinic depending on clinic picture.  Ongoing aggressive stroke risk factor management  Therapy recommendations: SNF Disposition: SNF Cerebral edema  Midline shift stable on CT head  Now off 3% saline  Na 139 on 06/03/2015  Recent right PCA infarct  With left hemianopia  Likely due to afib on on AC  Afib with RVR on eliquis PTA  Rate 90-115  Put back on home metoprolol and cardizem  Decrease cardizem to 120 due to bradycardia  Hold off eliquis due to ICH  Discontinue ASA as home medication  Hypertension  Malignant hypertension  Off cardene  Put back on home medications.  Now on:  Metoprolol 75mg  BID  Diltiazem XR 120mg  daily  Lisinopril 20mg  daily.  Will change to BID  Hyperlipidemia  Home meds:  No lipid lowering medications prior to admission  LDL 62, goal < 70  Continue statin at discharge  Diabetes  HgbA1c 7.6, goal < 7.0  SSI  Resume home meds with metformin and glipizide  CBG monitoring  Other Stroke Risk Factors  Advanced age  ETOH use  Hx stroke/TIA  Obstructive sleep apnea, on CPAP at home  Other Active Problems  HA - fioricet PRN  Nausea - zofran PRN  Anemia  Hospital day # 11  VASCULAR NEUROLOGY ATTENDING NOTE Patient was seen and examined by me personally. I reviewed notes, independently viewed imaging studies, participated in medical decision making and plan of care. The laboratory  and radiographic studies were personally reviewed by me.  ROS completed by me personally and pertinent positives fully documented.  Assessment and plan completed by me personally: Continue mobilization out of bed. Physical occupational speech therapy recommend SNF. Patient will likely need to be off anticoagulation for a month at least but will start aspirin in 1 week..    [facility] [place] [facility] Tuesday     SIGNED BY: Dr. Delia HeadyPramod Ajna Moors, MD

## 2015-06-07 NOTE — Clinical Social Work Note (Signed)
[facility].  CSW to continue to follow patient's progress.  Ervin KnackEric R. Roopa Graver, MSW, LCSWA [phone] 06/07/2015 8:42 AM

## 2015-06-07 NOTE — Progress Notes (Signed)
Pt had 2 cardiac pauses. One of 2.5 and the other of 2.75. Pt appears stable otherwise. Strips saved. On call MD notified. Will continue to monitor. Gara KronerHayles, Shyvonne Chastang M, RN

## 2015-06-07 NOTE — Clinical Social Work Note (Addendum)
[facility] [place] [facility] [place] who said they will be able to admit patient on Tuesday June 11, 2015.  [facility] to be picked up at 8am.  CSW updated case manager, and patient, CSW to continue to follow patient's progress.  Ervin KnackEric R. Laray Corbit, MSW, Theresia MajorsLCSWA [phone] 06/07/2015 2:13 PM

## 2015-06-07 NOTE — Progress Notes (Signed)
Inpatient Diabetes Program Recommendations  AACE/ADA: New Consensus Statement on Inpatient Glycemic Control (2015)  Target Ranges:  Prepandial:   less than 140 mg/dL      Peak postprandial:   less than 180 mg/dL (1-2 hours)      Critically ill patients:  140 - 180 mg/dL   Review of Glycemic ControlResults for Geroge BasemanRORIE, Ngoc C (MRN [id number]) as of 06/07/2015 13:59  Ref. Range 06/06/2015 18:14 06/06/2015 21:22 06/06/2015 23:43 06/07/2015 07:02 06/07/2015 11:44  Glucose-Capillary Latest Ref Range: 65-99 mg/dL 409178 (H) 811218 (H) 914137 (H) 89 193 (H)   Inpatient Diabetes Program Recommendations:    Please reduce Novolog correction to moderate tid with meals while patient is in the hospital.  Thanks, Beryl MeagerJenny Dylen Mcelhannon, RN, BC-ADM Inpatient Diabetes Coordinator Pager [phone] (8a-5p)

## 2015-06-08 DIAGNOSIS — K5909 Other constipation: Secondary | ICD-10-CM

## 2015-06-08 DIAGNOSIS — I482 Chronic atrial fibrillation: Secondary | ICD-10-CM

## 2015-06-08 DIAGNOSIS — K5900 Constipation, unspecified: Secondary | ICD-10-CM | POA: Insufficient documentation

## 2015-06-08 LAB — GLUCOSE, CAPILLARY
GLUCOSE-CAPILLARY: 112 mg/dL — AB (ref 65–99)
GLUCOSE-CAPILLARY: 149 mg/dL — AB (ref 65–99)
GLUCOSE-CAPILLARY: 114 mg/dL — AB (ref 65–99)
Glucose-Capillary: 125 mg/dL — ABNORMAL HIGH (ref 65–99)

## 2015-06-08 NOTE — Progress Notes (Signed)
STROKE TEAM PROGRESS NOTE  HISTORY DOCUMENTED AT TIME OF ADMISSION  [name] is an 60 y.o. 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 He was started at discharge from the hospital 3 days after the stroke onset, on Eliquis 5 mg twice a day. He has been compliant with the medication, took his last dose this morning at about 8 AM. He is also on aspirin 325 mg daily for the past few years and continues to take aspirin now, along with Eliquis. 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 This morning he woke up with right-sided headache, nausea and generalized fatigue but no other focal neurological symptoms. He was advised to go to the ER by his wife for further evaluation. Initial CT of the head showed a large right parieto-occipital parenchymal hemorrhage. Neurosurgery evaluated the patient in the ER and at this time no surgical intervention is planned. His blood pressure was elevated to 170s systolic has been started on nicardipine drip to maintain goal systolic blood pressure less than 130 to reduce the risk of hemorrhagic expansion. He was also started on Kcentra, after consultation with pharmacy.    SUBJECTIVE (INTERVAL HISTORY) Patient is awake and alert.  No complaints continued intermittent HA. HA related to efforts with reading.  States left ankle has and pain are diminished. Awaiting to go to [place].  Patient stated that he and his wife rely on each other and he would like to be re-evaluated prior to going to the [place] to ensure "he still needs to go."  OBJECTIVE Temp:  [97.5 F (36.4 C)-98.6 F (37 C)] 97.5 F (36.4 C) (01/14 0534) Pulse Rate:  [79-88] 82 (01/14 0534) Cardiac Rhythm:  [-] Atrial fibrillation (01/13 1944) Resp:  [16-18] 16 (01/14 0534) BP: (129-145)/(82-92) 137/92 mmHg (01/14 0534) SpO2:  [98 %-100 %] 100 % (01/14 0534)  CBC:    Recent Labs Lab 06/02/15 0600 06/03/15 0403  WBC 8.6 7.9  HGB 10.8* 10.3*  HCT 34.1* 32.4*  MCV 78.6 78.1  PLT 203 199    Basic Metabolic Panel:   Recent Labs Lab 06/02/15 1731 06/03/15 0927  NA 138 139  K  --  3.5  CL  --  101  CO2  --  26  GLUCOSE  --  159*  BUN  --  5*  CREATININE  --  0.84  CALCIUM  --  8.9    Lipid Panel:     Component Value Date/Time   CHOL 111 05/29/2015 0510   TRIG 72 05/29/2015 0510   HDL 35* 05/29/2015 0510   CHOLHDL 3.2 05/29/2015 0510   VLDL 14 05/29/2015 0510   LDLCALC 62 05/29/2015 0510   HgbA1c:  Lab Results  Component Value Date   HGBA1C 7.6* 05/29/2015   Urine Drug Screen:     Component Value Date/Time   LABOPIA POSITIVE* 05/27/2015 1944   COCAINSCRNUR NONE DETECTED 05/27/2015 1944   LABBENZ NONE DETECTED 05/27/2015 1944   AMPHETMU NONE DETECTED 05/27/2015 1944   THCU NONE DETECTED 05/27/2015 1944   LABBARB NONE DETECTED 05/27/2015 1944      IMAGING   Ct Angio Head and Neck W/cm &/or Wo Cm  05/27/2015   Aborted study due to extravasation of IV contrast into the arm.   Ct Head Wo Contrast 05/27/2015   Right parietal/occipital lobe hemorrhage, likely due to hemorrhagic transformation of a right  PCA distribution infarct. Mass effect with extension of hemorrhage into the right lateral ventricle and probable developing hydrocephal  05/28/2015 IMPRESSION: 1. Size stable parenchymal hemorrhage in the posterior right cerebral hemisphere, again suggestive of hemorrhagic PCA territory infarct. Mass effect with 8mm midline shift is stable. 2. Stable intraventricular extension with right temporal horn obstruction. 3. Correlation with reported outside imaging would be useful. Otherwise, surveillance is recommended.   05/30/2015  IMPRESSION: 1. Large right parieto-occipital intraparenchymal hemorrhage has increased mildly in size, measuring 8.4 x 5.0 cm, with mildly worsened surrounding edema. The degree of leftward midline shift is  relatively stable, measuring 7-8 mm. 2. Mild dilatation of the lateral ventricles in comparison to the prior study, concerning for mild obstructive hydrocephalus, with new blood noted filling the third ventricle. 3. Mild mass effect about the right cerebral peduncle, without evidence for transtentorial herniation at this time.     CT Head Wo Contrast 05/31/2015 Hemorrhagic right PCA infarct with intraventricular extension. The volume of hemorrhage and vasogenic edema is stable from yesterday but there is progressive lateral ventriculomegaly with increased leftward midline shift, now 13 mm.   CT Head Wo Contrast 06/01/2015 1. Hemorrhagic right PCA infarct status post interval hematoma evacuation. Residual hemorrhage as above. 2. Decreased midline shift, now 6 mm. 3. Decreased size of the ventricles.   TTE - - Left ventricle: The cavity size was normal. Systolic function was normal. The estimated ejection fraction was in the range of 60% to 65%. Wall motion was normal; there were no regional wall motion abnormalities. The study was not technically sufficient to allow evaluation of LV diastolic dysfunction due to atrial fibrillation. - Aortic valve: There was no regurgitation. - Aortic root: The aortic root was normal in size. - Mitral valve: Structurally normal valve. There was mild regurgitation. - Left atrium: The atrium was mildly dilated. - Right ventricle: The cavity size was normal. Wall thickness was normal. Systolic function was normal. - Right atrium: The atrium was normal in size. - Tricuspid valve: There was mild regurgitation. - Pulmonic valve: There was no regurgitation. - Pulmonary arteries: Systolic pressure was within the normal range. - Inferior vena cava: The vessel was normal in size. - Pericardium, extracardiac: There was no pericardial effusion.     PHYSICAL EXAM  Temp:  [97.5 F (36.4 C)-98.6 F (37 C)] 97.5 F (36.4 C) (01/14 0534) Pulse  Rate:  [79-88] 82 (01/14 0534) Resp:  [16-18] 16 (01/14 0534) BP: (129-145)/(82-92) 137/92 mmHg (01/14 0534) SpO2:  [98 %-100 %] 100 % (01/14 0534)  General - Well nourished, well developed, in no acute distress. HEENT:   ; moist mucous membranes; nose and throat clear Cardiovascular - Regular rate and rhythm. Abd:  Benign Extr:  No  Edema on right; slight swelling of left   Mental Status -  Level of arousal and orientation to time, place, and person were intact. Language including expression, naming, repetition, comprehension was assessed and found intact. Attention span and concentration were normal. Fund of Knowledge was assessed and was intact.  Cranial Nerves II - XII - II - left homonymous hemianopia. III, IV, VI - Extraocular movements intact. V - Facial sensation intact bilaterally. VII - Facial movement intact bilaterally.mild left nasolabial asymmetry VIII - Hearing intact bilaterally. X - Palate elevates symmetrically. XI - Chin turning & shoulder shrug intact bilaterally. XII - Tongue protrusion intact.  Motor Strength - The patient's strength was normal in all extremities and pronator drift was absent.  Bulk was normal and  fasciculations were absent.    Motor Tone - Muscle tone was assessed at the neck and appendages and was normal.  Sensory - Light touch, temperature/pinprick were assessed and were symmetrical.    Coordination - The patient had normal movements in the hands with no ataxia or dysmetria.    Gait and Station - not tested.  ASSESSMENT/PLAN [name] is a 60 y.o. 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  He did not receive IV t-PA due to hemorrhagic stroke.  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 May consider resume eliquis in about 4-6 weeks when following up in clinic depending on clinic picture.  Ongoing aggressive stroke risk factor management  Therapy recommendations: SNF Disposition: SNF  Cerebral edema  Midline shift stable on CT head  Now off 3% saline  Na 139 on 06/03/2015    Recent right PCA infarct  With left hemianopia  Likely due to afib on on AC    Afib with RVR on eliquis PTA  Rate 90-115  Put back on home metoprolol and cardizem  Decrease cardizem to 120 due to bradycardia  Hold off eliquis due to ICH  Discontinue ASA as home medication    Hypertension  Malignant hypertension  Off cardene  Put back on home medications.  Now on:  Metoprolol 75mg  BID  Diltiazem XR 120mg  daily  Lisinopril 20mg  daily.  Will change to BID  Hyperlipidemia  Home meds:  No lipid lowering medications prior to admission  LDL 62, goal < 70  Continue statin at discharge  Diabetes  HgbA1c 7.6, goal < 7.0  SSI  Resume home meds with metformin and glipizide  CBG monitoring  Other Stroke Risk Factors  Advanced age  ETOH use  Hx stroke/TIA  Obstructive sleep apnea, on CPAP at home  Other Active Problems  HA - fioricet PRN  Nausea - zofran PRN  Anemia  PLAN  Check labs in a.m.  May consider resume eliquis in about 4-6 weeks when following up in clinic depending on  clinic picture.  Transfer to [facility] in [facility] Tuesday June 11, 2015  Ordered Miralax to be given today  Hospital day # [address]   Delton See [facility] Pager [phone] 06/08/2015, 12:49 PM   VASCULAR NEUROLOGY ATTENDING NOTE Patient was seen and examined by me personally. I reviewed notes, independently viewed imaging studies, participated in medical decision making and plan of care. The laboratory and radiographic studies were personally reviewed by me.  ROS completed by me personally and pertinent positives fully documented.  Assessment and plan completed by me personally:  Continue mobilization out of bed. Physical occupational speech therapy recommend SNF. Patient will likely need to be off  anticoagulation for a month at least but will start aspirin in 1 week..    Should have Ophthmalogy eval before [facility]  Check labs in a.m.  May consider resume eliquis in about 4-6 weeks when following up in clinic depending on clinic picture.  Transfer to [facility] in [facility] Tuesday June 11, 2015  Ordered Miralax to be given today   SIGNED BY: Dr. Sula Soda

## 2015-06-09 DIAGNOSIS — I618 Other nontraumatic intracerebral hemorrhage: Secondary | ICD-10-CM

## 2015-06-09 LAB — GLUCOSE, CAPILLARY
GLUCOSE-CAPILLARY: 190 mg/dL — AB (ref 65–99)
Glucose-Capillary: 84 mg/dL (ref 65–99)
Glucose-Capillary: 124 mg/dL — ABNORMAL HIGH (ref 65–99)
Glucose-Capillary: 95 mg/dL (ref 65–99)

## 2015-06-09 LAB — COMPREHENSIVE METABOLIC PANEL
ALBUMIN: 2.9 g/dL — AB (ref 3.5–5.0)
ALT: 25 U/L (ref 17–63)
AST: 22 U/L (ref 15–41)
Alkaline Phosphatase: 57 U/L (ref 38–126)
Anion gap: 11 (ref 5–15)
BILIRUBIN TOTAL: 0.3 mg/dL (ref 0.3–1.2)
BUN: 9 mg/dL (ref 6–20)
CHLORIDE: 102 mmol/L (ref 101–111)
CO2: 29 mmol/L (ref 22–32)
Calcium: 9.1 mg/dL (ref 8.9–10.3)
Creatinine, Ser: 0.86 mg/dL (ref 0.61–1.24)
GFR calc Af Amer: >60 mL/min (ref >60)
GFR calc non Af Amer: >60 mL/min (ref >60)
GLUCOSE: 100 mg/dL — AB (ref 65–99)
POTASSIUM: 3.7 mmol/L (ref 3.5–5.1)
SODIUM: 142 mmol/L (ref 135–145)
TOTAL PROTEIN: 5.7 g/dL — AB (ref 6.5–8.1)

## 2015-06-09 LAB — CBC
HEMATOCRIT: 30.2 % — AB (ref 39.0–52.0)
Hemoglobin: 9.4 g/dL — ABNORMAL LOW (ref 13.0–17.0)
MCH: 24.6 pg — ABNORMAL LOW (ref 26.0–34.0)
MCHC: 31.1 g/dL (ref 30.0–36.0)
MCV: 79.1 fL (ref 78.0–100.0)
Platelets: 299 10*3/uL (ref 150–400)
RBC: 3.82 MIL/uL — ABNORMAL LOW (ref 4.22–5.81)
RDW: 14.3 % (ref 11.5–15.5)
WBC: 6.3 10*3/uL (ref 4.0–10.5)

## 2015-06-09 MED ORDER — MAGNESIUM OXIDE 400 (241.3 MG) MG PO TABS
400.0000 mg | ORAL_TABLET | Freq: Two times a day (BID) | ORAL | Status: DC
  Administered 2015-06-09 – 2015-06-11 (×4): 400 mg via ORAL
  Filled 2015-06-09 (×4): qty 1

## 2015-06-09 NOTE — Progress Notes (Signed)
RT NOTE  Pt asked to have RN remind him around 2300 to go on CPAP, he stated he falls asleep. RN stated she would assist. RT just stopped in to assess patient and he was wearing CPAP and asleep. RT will monitor.

## 2015-06-09 NOTE — Progress Notes (Addendum)
STROKE TEAM PROGRESS NOTE  HISTORY DOCUMENTED AT TIME OF ADMISSION  DELAND SLOCUMB is an 60 y.o. 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 He was started at discharge from the hospital 3 days after the stroke onset, on Eliquis 5 mg twice a day. He has been compliant with the medication, took his last dose this morning at about 8 AM. He is also on aspirin 325 mg daily for the past few years and continues to take aspirin now, along with Eliquis. 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 This morning he woke up with right-sided headache, nausea and generalized fatigue but no other focal neurological symptoms. He was advised to go to the ER by his wife for further evaluation. Initial CT of the head showed a large right parieto-occipital parenchymal hemorrhage. Neurosurgery evaluated the patient in the ER and at this time no surgical intervention is planned. His blood pressure was elevated to 170s systolic has been started on nicardipine drip to maintain goal systolic blood pressure less than 130 to reduce the risk of hemorrhagic expansion. He was also started on Kcentra, after consultation with pharmacy.    SUBJECTIVE (INTERVAL HISTORY) Patient is awake and alert.  No complaints continued intermittent HA. HA related to efforts with reading.  States left ankle has and pain are diminished. Awaiting to go to [place].  Patient reports seeing visual disturbance in the field of the visual deficit.  It appears to be shaped like a small man.  We discussed that the phenomenon is something sometimes experienced by people with homonymous hemianopsia.   OBJECTIVE Temp:  [97.5 F (36.4 C)-98.3 F (36.8 C)] 97.9 F (36.6 C) (01/15 0943) Pulse Rate:  [72-110] 110 (01/15 0943) Cardiac Rhythm:  [-] Atrial fibrillation (01/15 0704) Resp:  [16-18] 18 (01/15 0943) BP:  (125-145)/(84-104) 136/84 mmHg (01/15 0943) SpO2:  [100 %] 100 % (01/15 0943)  CBC:   Recent Labs Lab 06/03/15 0403 06/09/15 0220  WBC 7.9 6.3  HGB 10.3* 9.4*  HCT 32.4* 30.2*  MCV 78.1 79.1  PLT 199 299    Basic Metabolic Panel:   Recent Labs Lab 06/03/15 0927 06/09/15 0220  NA 139 142  K 3.5 3.7  CL 101 102  CO2 26 29  GLUCOSE 159* 100*  BUN 5* 9  CREATININE 0.84 0.86  CALCIUM 8.9 9.1    Lipid Panel:     Component Value Date/Time   CHOL 111 05/29/2015 0510   TRIG 72 05/29/2015 0510   HDL 35* 05/29/2015 0510   CHOLHDL 3.2 05/29/2015 0510   VLDL 14 05/29/2015 0510   LDLCALC 62 05/29/2015 0510   HgbA1c:  Lab Results  Component Value Date   HGBA1C 7.6* 05/29/2015   Urine Drug Screen:     Component Value Date/Time   LABOPIA POSITIVE* 05/27/2015 1944   COCAINSCRNUR NONE DETECTED 05/27/2015 1944   LABBENZ NONE DETECTED 05/27/2015 1944   AMPHETMU NONE DETECTED 05/27/2015 1944   THCU NONE DETECTED 05/27/2015 1944   LABBARB NONE DETECTED 05/27/2015 1944      IMAGING   Ct Angio Head and Neck W/cm &/or Wo Cm  05/27/2015   Aborted study due to extravasation of IV contrast into the arm.   Ct Head Wo Contrast 05/27/2015   Right parietal/occipital lobe hemorrhage, likely due to hemorrhagic transformation of a right PCA distribution infarct. Mass effect with extension of  hemorrhage into the right lateral ventricle and probable developing hydrocephal  05/28/2015 IMPRESSION: 1. Size stable parenchymal hemorrhage in the posterior right cerebral hemisphere, again suggestive of hemorrhagic PCA territory infarct. Mass effect with 8mm midline shift is stable. 2. Stable intraventricular extension with right temporal horn obstruction. 3. Correlation with reported outside imaging would be useful. Otherwise, surveillance is recommended.   05/30/2015  IMPRESSION: 1. Large right parieto-occipital intraparenchymal hemorrhage has increased mildly in size, measuring 8.4 x 5.0 cm,  with mildly worsened surrounding edema. The degree of leftward midline shift is relatively stable, measuring 7-8 mm. 2. Mild dilatation of the lateral ventricles in comparison to the prior study, concerning for mild obstructive hydrocephalus, with new blood noted filling the third ventricle. 3. Mild mass effect about the right cerebral peduncle, without evidence for transtentorial herniation at this time.     CT Head Wo Contrast 05/31/2015 Hemorrhagic right PCA infarct with intraventricular extension. The volume of hemorrhage and vasogenic edema is stable from yesterday but there is progressive lateral ventriculomegaly with increased leftward midline shift, now 13 mm.   CT Head Wo Contrast 06/01/2015 1. Hemorrhagic right PCA infarct status post interval hematoma evacuation. Residual hemorrhage as above. 2. Decreased midline shift, now 6 mm. 3. Decreased size of the ventricles.   TTE - - Left ventricle: The cavity size was normal. Systolic function was normal. The estimated ejection fraction was in the range of 60% to 65%. Wall motion was normal; there were no regional wall motion abnormalities. The study was not technically sufficient to allow evaluation of LV diastolic dysfunction due to atrial fibrillation. - Aortic valve: There was no regurgitation. - Aortic root: The aortic root was normal in size. - Mitral valve: Structurally normal valve. There was mild regurgitation. - Left atrium: The atrium was mildly dilated. - Right ventricle: The cavity size was normal. Wall thickness was normal. Systolic function was normal. - Right atrium: The atrium was normal in size. - Tricuspid valve: There was mild regurgitation. - Pulmonic valve: There was no regurgitation. - Pulmonary arteries: Systolic pressure was within the normal range. - Inferior vena cava: The vessel was normal in size. - Pericardium, extracardiac: There was no pericardial effusion.     PHYSICAL  EXAM  Temp:  [97.5 F (36.4 C)-98.3 F (36.8 C)] 97.9 F (36.6 C) (01/15 0943) Pulse Rate:  [72-110] 110 (01/15 0943) Resp:  [16-18] 18 (01/15 0943) BP: (125-145)/(84-104) 136/84 mmHg (01/15 0943) SpO2:  [100 %] 100 % (01/15 0943)  General - Well nourished, well developed, in no acute distress. HEENT:   ; moist mucous membranes; nose and throat clear Cardiovascular - Regular rate and rhythm. Abd:  Benign Extr:  No  Edema on right; slight swelling of left   Mental Status -  Level of arousal and orientation to time, place, and person were intact. Language including expression, naming, repetition, comprehension was assessed and found intact. Attention span and concentration were normal. Fund of Knowledge was assessed and was intact.  Cranial Nerves II - XII - II - left homonymous hemianopia. III, IV, VI - Extraocular movements intact. V - Facial sensation intact bilaterally. VII - Facial movement intact bilaterally.mild left nasolabial asymmetry VIII - Hearing intact bilaterally. X - Palate elevates symmetrically. XI - Chin turning & shoulder shrug intact bilaterally. XII - Tongue protrusion intact.  Motor Strength - The patient's strength was normal in all extremities and pronator drift was absent.  Bulk was normal and fasciculations were absent.    Motor Tone -  Muscle tone was assessed at the neck and appendages and was normal.  Sensory - Light touch, temperature/pinprick were assessed and were symmetrical.    Coordination - The patient had normal movements in the hands with no ataxia or dysmetria.    Gait and Station - not tested.    ASSESSMENT/PLAN Mr. VEGAS FRITZE is a 60 y.o. 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  He did not receive IV t-PA due to hemorrhagic  stroke.  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 May consider resume eliquis in about 4-6 weeks when following up in clinic depending on clinic picture.  Ongoing aggressive stroke risk factor management  Therapy recommendations: SNF  [facility]: [facility] June 11, 2015    Cerebral edema  Midline shift stable on CT head  Now off 3% saline  Na 139 on 06/03/2015    Recent right PCA infarct  With left hemianopia  Likely due to afib on on AC    Afib with RVR on eliquis PTA  Rate 90-115  Put back on home metoprolol and cardizem  Decrease cardizem to 120 due to bradycardia  Hold off eliquis due to ICH  Discontinue ASA as home medication    Hypertension  Malignant hypertension  Off cardene  Put back on home medications.  Now on:  Metoprolol 75mg  BID  Diltiazem XR 120mg  daily  Lisinopril 20mg  daily.  Will change to BID  Hyperlipidemia  Home meds:  No lipid lowering medications prior to admission  LDL 62, goal < 70  Continue statin at discharge  Diabetes  HgbA1c 7.6, goal < 7.0  SSI  Resume home meds with metformin and glipizide  CBG monitoring  Other Stroke Risk Factors  Advanced age  ETOH use  Hx stroke/TIA  Obstructive sleep apnea, on CPAP at home  Other Active Problems  HA - fioricet PRN  Nausea -  zofran PRN  Anemia   Hospital day # [address]   Delton See [facility] Pager [phone] 06/09/2015, 1:23 PM   VASCULAR NEUROLOGY ATTENDING NOTE Patient was seen and examined by me personally. I reviewed notes, independently viewed imaging studies, participated in medical decision making and plan of care. The laboratory and radiographic studies were personally reviewed by me.  ROS completed by me personally and pertinent positives fully documented.  Assessment and plan completed by me personally: PLAN  May consider resume eliquis in about 4-6 weeks when following up in clinic depending on clinic picture.  Transfer to [facility] in [facility] Tuesday June 11, 2015  Continue Miralax.  Patient reports successful efforts to have a bowel movement.  Added magnesium and riboflavin for headache.  Pharmacy to seek approval for non-formulary  Riboflavin    SIGNED BY: Dr. Sula Sodahere Allyne Hebert

## 2015-06-09 NOTE — Discharge Summary (Signed)
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 S/p right occipital craniotomy and evacuation of intracerebral blood clot on 05/31/2015 for neurological worsening Active Problems:   Hemorrhagic stroke [facility])   Intracerebral bleed (HCC)   Malignant hypertension   Essential hypertension   Atrial fibrillation (HCC)   History of CVA with residual deficit   Acute blood loss anemia   Constipation  BMI: Body mass index is 27.07 kg/(m^2).  Past Medical History  Diagnosis Date  . Hypertension   . Dysrhythmia     afib  . Sleep apnea     cpap for 2 yrs  . GERD (gastroesophageal reflux disease)   . Arthritis   . Complication of anesthesia     hx twice of tachy after surgery  . Refusal of blood transfusions as patient is Jehovah's Witness   . PONV (postoperative nausea and vomiting)   . 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 Stroke [facility])    Past Surgical History  Procedure Laterality Date  . Lithotriopsy  15    ? stone -could not find  . Joint replacement Right 10    knee  . Back surgery      x4 2 diskectomies -2 fusions last 08  . Knee arthroplasty Left 12/24/2014    Procedure: COMPUTER ASSISTED TOTAL KNEE ARTHROPLASTY;  Surgeon: Eldred Manges, MD;  Location: [facility] OR;  Service: Orthopedics;  Laterality: Left;  . Total knee arthroplasty Left 12/24/2014  . Craniotomy Right 05/31/2015    Procedure: Occipital Craniotomy for Intracerebral Hematoma ;  Surgeon: [name], MD;  Location: [facility] NEURO ORS;  Service:  Neurosurgery;  Laterality: Right;      Medication List    STOP taking these medications        aspirin EC 325 MG tablet  Replaced by:  aspirin 81 MG chewable tablet     methocarbamol 500 MG tablet  Commonly known as:  ROBAXIN     oxyCODONE-acetaminophen 10-325 MG tablet  Commonly known as:  PERCOCET      TAKE these medications        aspirin 81 MG chewable tablet  Chew 1 tablet (81 mg total) by mouth daily.     diltiazem 120 MG 24 hr capsule  Commonly known as:  DILACOR XR  Take 240 mg by mouth daily.     docusate sodium 100 MG capsule  Commonly known as:  COLACE  Take 1 capsule (100 mg total) by mouth 2 (two) times daily.     feeding supplement (GLUCERNA SHAKE) Liqd  Take 237 mLs by mouth 3 (three) times daily between meals.     gabapentin 600 MG tablet  Commonly known as:  NEURONTIN  Take 600 mg by mouth 4 (four) times daily.     glipiZIDE 10 MG tablet  Commonly known as:  GLUCOTROL  Take 10 mg by mouth daily before breakfast.     HYDROcodone-acetaminophen 10-325 MG tablet  Commonly known as:  NORCO  Take 1 tablet by mouth every 6 (six) hours as  needed for severe pain.     levETIRAcetam 500 MG tablet  Commonly known as:  KEPPRA  Take 1 tablet (500 mg total) by mouth 2 (two) times daily.     lisinopril 20 MG tablet  Commonly known as:  PRINIVIL,ZESTRIL  Take 1 tablet (20 mg total) by mouth 2 (two) times daily.     metFORMIN 850 MG tablet  Commonly known as:  GLUCOPHAGE  Take 850 mg by mouth 2 (two) times daily with a meal.     metoprolol 50 MG tablet  Commonly known as:  LOPRESSOR  Take 75 mg by mouth 2 (two) times daily.     omeprazole 20 MG capsule  Commonly known as:  PRILOSEC  Take 20 mg by mouth daily.        LABORATORY STUDIES CBC    Component Value Date/Time   WBC 6.3 06/09/2015 0220   RBC 3.82* 06/09/2015 0220   RBC 2.95* 03/14/2007 1007   HGB 9.4* 06/09/2015 0220   HCT 30.2* 06/09/2015 0220   PLT 299 06/09/2015 0220   MCV 79.1  06/09/2015 0220   MCH 24.6* 06/09/2015 0220   MCHC 31.1 06/09/2015 0220   RDW 14.3 06/09/2015 0220   LYMPHSABS 1.7 05/27/2015 1630   MONOABS 0.8 05/27/2015 1630   EOSABS 0.0 05/27/2015 1630   BASOSABS 0.0 05/27/2015 1630   CMP    Component Value Date/Time   NA 142 06/09/2015 0220   K 3.7 06/09/2015 0220   CL 102 06/09/2015 0220   CO2 29 06/09/2015 0220   GLUCOSE 100* 06/09/2015 0220   BUN 9 06/09/2015 0220   CREATININE 0.86 06/09/2015 0220   CALCIUM 9.1 06/09/2015 0220   PROT 5.7* 06/09/2015 0220   ALBUMIN 2.9* 06/09/2015 0220   AST 22 06/09/2015 0220   ALT 25 06/09/2015 0220   ALKPHOS 57 06/09/2015 0220   BILITOT 0.3 06/09/2015 0220   GFRNONAA >60 06/09/2015 0220   GFRAA >60 06/09/2015 0220   COAGS Lab Results  Component Value Date   INR 1.34 05/27/2015   INR 1.14 12/14/2014   INR 1.7* 06/21/2008   Lipid Panel    Component Value Date/Time   CHOL 111 05/29/2015 0510   TRIG 72 05/29/2015 0510   HDL 35* 05/29/2015 0510   CHOLHDL 3.2 05/29/2015 0510   VLDL 14 05/29/2015 0510   LDLCALC 62 05/29/2015 0510   HgbA1C  Lab Results  Component Value Date   HGBA1C 7.6* 05/29/2015   Cardiac Panel (last 3 results) No results for input(s): CKTOTAL, CKMB, TROPONINI, RELINDX in the last 72 hours. Urinalysis    Component Value Date/Time   COLORURINE YELLOW 05/27/2015 1944   APPEARANCEUR CLEAR 05/27/2015 1944   LABSPEC 1.006 05/27/2015 1944   PHURINE 7.5 05/27/2015 1944   GLUCOSEU NEGATIVE 05/27/2015 1944   HGBUR NEGATIVE 05/27/2015 1944   BILIRUBINUR NEGATIVE 05/27/2015 1944   KETONESUR NEGATIVE 05/27/2015 1944   PROTEINUR NEGATIVE 05/27/2015 1944   UROBILINOGEN 1.0 12/14/2014 1045   NITRITE NEGATIVE 05/27/2015 1944   LEUKOCYTESUR NEGATIVE 05/27/2015 1944   Urine Drug Screen     Component Value Date/Time   LABOPIA POSITIVE* 05/27/2015 1944   COCAINSCRNUR NONE DETECTED 05/27/2015 1944   LABBENZ NONE DETECTED 05/27/2015 1944   AMPHETMU NONE DETECTED 05/27/2015  1944   THCU NONE DETECTED 05/27/2015 1944   LABBARB NONE DETECTED 05/27/2015 1944    Alcohol Level    Component Value Date/Time   ETH <5 05/27/2015 1630     SIGNIFICANT DIAGNOSTIC STUDIES  Ct Angio Head and Neck W/cm &/or Wo Cm  05/27/2015  Aborted study due to extravasation of IV contrast into the arm.   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 Mass effect with extension of hemorrhage into the right lateral ventricle and probable developing hydrocephal  05/28/2015 IMPRESSION:  1. Size stable parenchymal hemorrhage in the posterior right cerebral hemisphere, again suggestive of hemorrhagic PCA territory infarct. Mass effect with 8mm midline shift is stable.  2. Stable intraventricular extension with right temporal horn obstruction.  3. Correlation with reported outside imaging would be useful. Otherwise, surveillance is recommended.   05/30/2015 IMPRESSION:  1. Large right parieto-occipital intraparenchymal hemorrhage has increased mildly in size, measuring 8.4 x 5.0 cm, with mildly worsened surrounding edema. The degree of leftward midline shift is relatively stable, measuring 7-8 mm.  2. Mild dilatation of the lateral ventricles in comparison to the prior study, concerning for mild obstructive hydrocephalus, with new blood noted filling the third ventricle.  3. Mild mass effect about the right cerebral peduncle, without evidence for transtentorial herniation at this time.    CT Head Wo Contrast 05/31/2015 Hemorrhagic right PCA infarct with intraventricular extension. The volume of hemorrhage and vasogenic edema is stable from yesterday but there is progressive lateral ventriculomegaly with increased leftward midline shift, now 13 mm.   CT Head Wo Contrast 06/01/2015 1. Hemorrhagic right PCA infarct status post interval hematoma evacuation. Residual hemorrhage as  above. 2. Decreased midline shift, now 6 mm. 3. Decreased size of the ventricles.   TTE - - Left ventricle: The cavity size was normal. Systolic function was normal. The estimated ejection fraction was in the range of 60% to 65%. Wall motion was normal; there were no regional wall motion abnormalities. The study was not technically sufficient to allow evaluation of LV diastolic dysfunction due to atrial fibrillation. - Aortic valve: There was no regurgitation. - Aortic root: The aortic root was normal in size. - Mitral valve: Structurally normal valve. There was mild regurgitation. - Left atrium: The atrium was mildly dilated. - Right ventricle: The cavity size was normal. Wall thickness was normal. Systolic function was normal. - Right atrium: The atrium was normal in size. - Tricuspid valve: There was mild regurgitation. - Pulmonic valve: There was no regurgitation. - Pulmonary arteries: Systolic pressure was within the normal range. - Inferior vena cava: The vessel was normal in size. - Pericardium, extracardiac: There was no pericardial effusion.     HISTORY OF PRESENT ILLNESS  OLUWATOMISIN DEMAN is a 60 y.o. male with history of hypertension, obstructive sleep apnea, arthritis, diabetes mellitus, atrial fibrillation, and recent right PCA infarct on 05/14/2015, admitted to the [facility]. He was started at discharge from the hospital 3 days after the stroke onset, on Eliquis 5 mg twice a day. He has been compliant with the medication, took his last dose this morning at about 8 AM. He is also on aspirin 325 mg daily for the past few years and continues to take aspirin now, along with Eliquis. 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 On the morning of admission he woke up with right-sided headache, nausea and generalized fatigue but no other focal neurological symptoms. He was advised to go to  the ER by his wife for further evaluation. Initial CT of the head showed a large right parieto-occipital parenchymal hemorrhage.  Neurosurgery evaluated the patient in the ER and no surgical intervention was felt to be indicated at that time. His blood pressure was elevated to 170s systolic and he was started on a nicardipine drip to maintain goal systolic blood pressure less than 130 to reduce the risk of hemorrhagic expansion. He was also started on Kcentra, after consultation with pharmacy and admitted to the care unit.    HOSPITAL COURSE  Mr. TIMBER LUCARELLI is a 60 y.o. male with history of hypertension, obstructive sleep apnea, arthritis, diabetes mellitus, atrial fibrillation, and recent right PCA infarct on 05/14/2015, presenting with right-sided headache, nausea and generalized fatigue.Initial CT of the head showed a large right parieto-occipital parenchymal hemorrhage. Neurosurgery evaluated the patient initially in the ER and no surgical intervention was felt to be indicated at that time. He did not receive IV t-PA due to hemorrhagic stroke. He was subsequently admitted to the neuro intensive care unit. Patient had subsequent neurological decline on 05/31/15 with CT scan showing increasing hemorrhage with hydrocephalus and midline shift and neurosurgery did emergent evacuation of the hematoma with right occipital craniotomy. Patient did well postoperatively and had a dense left hemianopsia and only a mild left hemiparesis. His condition gradually improved. He complained of left ankle pain while walking and x-ray of the ankle did not reveal any fracture. Uric acid level was normal. He was empirically treated with a course of indomethacin and colchicine which seem to help and this was discontinued at the time of discharge. On the day of discharge his neurological and medical condition was stable. He still had persistent dense left anopsia and were advised not to drive. Patient was started on aspirin at  the time of discharge with the plan to be switched to eliquis one month after his intracerebral hemorrhage for stroke prevention for atrial fibrillation. He was advised not to drive due to his peripheral vision loss ICH: Right occipitoparietal ICH may be due to hemorrhagic transformation of recent right PCA infarct in the setting of newly started eliquis s/p craniotomy 05/31/15  Resultant  dense left hemianopia. Mild left hemiparesis improved  MRI / MRA not performed this time, will do after blood absorbed  Repeat CT head showed stable hematoma and midline shift   CTA head and neck - (aborted due to contrast extravasation)  2D Echo - EF 60-65%  LDL 62  HgbA1c 7.6  VTE prophylaxis - SCDs  Diet Carb Modified Fluid consistency:: Thin; Room service appropriate?: Yes  aspirin 325 mg daily and Eliquis (apixaban) daily prior to admission, now on No antithrombotic secondary to hemorrhage. May consider resume eliquis in about 4-6 weeks when following up in clinic depending on clinic picture.  Ongoing aggressive stroke risk factor management  Therapy recommendations: SNF  [facility]: [facility], 2015    Cerebral edema  Midline shift stable on CT head  Now off 3% saline  Na 139 on 06/03/2015   Recent right PCA infarct  With left hemianopia  Likely due to afib on on AC     Afib with RVR on eliquis PTA  Rate 90-115  Put back on home metoprolol and cardizem  Decrease cardizem to 120 due to bradycardia  Hold off eliquis due to ICH  Discontinue ASA as home medication    Hypertension  Malignant hypertension  Off cardene  Put back on home medications. Now on:  Metoprolol 75mg  BID  Diltiazem XR 120mg  daily  Lisinopril 20mg  daily. Will change to BID  Hyperlipidemia  Home meds: No lipid lowering medications prior to admission  LDL 62, goal < 70  Continue statin at discharge    Diabetes  HgbA1c 7.6, goal <  7.0  SSI  Resume home meds with metformin and glipizide  CBG monitoring   Other Stroke Risk Factors  Advanced age  ETOH use  Hx stroke/TIA  Obstructive sleep apnea, on CPAP at home   Other Active Problems  HA - fioricet PRN  Nausea - zofran PRN  Anemia   PLAN   May   resume eliquis in about 4-6 weeks when following up in clinic    [facility] 2015  Do not drive unless peripheral vision loss improves    DISCHARGE EXAM Blood pressure 150/100, pulse 71, temperature 97.8 F (36.6 C), temperature source Oral, resp. rate 18, height 6' 2.5" (1.892 m), weight 213 lb 10 oz (96.9 kg), SpO2 98 %. Mental Status -  Level of arousal and orientation to time, place, and person were intact. Language including expression, naming, repetition, comprehension was assessed and found intact. Attention span and concentration were normal. Fund of Knowledge was assessed and was intact.  Cranial Nerves II - XII - II - left homonymous hemianopia. III, IV, VI - Extraocular movements intact. V - Facial sensation intact bilaterally. VII - Facial movement intact bilaterally.mild left nasolabial asymmetry VIII - Hearing intact bilaterally. X - Palate elevates symmetrically. XI - Chin turning & shoulder shrug intact bilaterally. XII - Tongue protrusion intact.  Motor Strength - The patient's strength was normal in all extremities and pronator drift was absent. Bulk was normal and fasciculations were absent.   Motor Tone - Muscle tone was assessed at the neck and appendages and was normal.  Sensory - Light touch, temperature/pinprick were assessed and were symmetrical.   Coordination - The patient had normal movements in the hands with no ataxia or dysmetria.      Discharge Diet   Diet Carb Modified Fluid consistency:: Thin; Room service appropriate?: Yes liquids  DISCHARGE PLAN   Dis[facility]:  [facility]  No antithrombotic for secondary stroke prevention secondary to hemorrhage  [facility]   Follow-[name] in 2 months.  40 minutes were spent preparing discharge.  Delia HeadyPramod Sethi, MD

## 2015-06-10 LAB — GLUCOSE, CAPILLARY
GLUCOSE-CAPILLARY: 99 mg/dL (ref 65–99)
GLUCOSE-CAPILLARY: 98 mg/dL (ref 65–99)
GLUCOSE-CAPILLARY: 80 mg/dL (ref 65–99)
GLUCOSE-CAPILLARY: 148 mg/dL — AB (ref 65–99)

## 2015-06-10 MED ORDER — ASPIRIN 81 MG PO CHEW
81.0000 mg | CHEWABLE_TABLET | Freq: Every day | ORAL | Status: DC
  Administered 2015-06-10 – 2015-06-11 (×2): 81 mg via ORAL
  Filled 2015-06-10 (×2): qty 1

## 2015-06-10 NOTE — Progress Notes (Signed)
Physical Therapy Treatment Patient Details Name: Trevor BasemanOtis C Pasillas MRN: [id number] DOB: 1955-09-07 Today's Date: 06/10/2015    History of Present Illness pt presents with probable hemorrhagic transformation of prior PCA infarct 05/14/15 - head CT showed:  Large Rt parieto-occipital intraparenchymal hemorrhage, mild dilation of lateral ventricles, mild mass effect.  Pt underwent Rt occipital craniotomy for intracerebral hemorrhage on 05/31/15.  Pt with hx of A-fib, HTN, OSA, TKA, and DM.     PT Comments    Patient's left ankle pain gone! Able to ambulate with minguard assist without device (1 small loss of balance over 250 ft). Continuing to learn to compensate for visual cut on Lt.   Follow Up Recommendations  SNF (CIR not an option due to [employer] insurance)     Equipment Recommendations  None recommended by PT    Recommendations for Other Services       Precautions / Restrictions Precautions Precautions: Fall Precaution Comments: L Hemianopsia from recent R PCA Infarct Restrictions Weight Bearing Restrictions: No    Mobility  Bed Mobility Overal bed mobility: Modified Independent                Transfers Overall transfer level: Needs assistance Equipment used: None Transfers: Sit to/from Stand Sit to Stand: Supervision         General transfer comment: no further ankle pain  Ambulation/Gait Ambulation/Gait assistance: Min guard Ambulation Distance (Feet): 250 Feet   Gait Pattern/deviations: Step-through pattern;Decreased stride length;Decreased dorsiflexion - right     General Gait Details: ankle pain gone!; poor ability to vary speed up or down; one small trip dragging Rt foot   Stairs Stairs: Yes Stairs assistance: Min guard Stair Management: No rails;One rail Left;Alternating pattern;Step to pattern;Forwards Number of Stairs: 2 General stair comments: hesitant due to Lt knee pain  Wheelchair Mobility    Modified Rankin (Stroke Patients Only) Modified  Rankin (Stroke Patients Only) Pre-Morbid Rankin Score: Slight disability Modified Rankin: Moderately severe disability     Balance     Sitting balance-Leahy Scale: Fair     Standing balance support: No upper extremity supported Standing balance-Leahy Scale: Good                      Cognition Arousal/Alertness: Awake/alert Behavior During Therapy: Flat affect Overall Cognitive Status: Within Functional Limits for tasks assessed                      Exercises      General Comments        Pertinent Vitals/Pain Pain Assessment: No/denies pain    Home Living                      Prior Function            PT Goals (current goals can now be found in the care plan section) Acute Rehab PT Goals Patient Stated Goal: none stated Time For Goal Achievement: 06/06/15 Progress towards PT goals: Progressing toward goals    Frequency  Min 3X/week    PT Plan Current plan remains appropriate    Co-evaluation PT/OT/SLP Co-Evaluation/Treatment:  (initially, then PT returned to assist w/ [facility])           End of Session Equipment Utilized During Treatment: Gait belt Activity Tolerance: Patient tolerated treatment well Patient left: in chair;with call bell/phone within reach (nursing states no longer needs chair alarm)     Time: [id number] PT Time Calculation (min) (  ACUTE ONLY): 12 min  Charges:  $Gait Training: 8-22 mins                    G Codes:      Warnie Belair 06-13-15, 4:42 PM Pager [phone]

## 2015-06-10 NOTE — Progress Notes (Signed)
STROKE TEAM PROGRESS NOTE  HISTORY DOCUMENTED AT TIME OF ADMISSION  [name] is an 60 y.o. 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. He was started at discharge from the hospital 3 days after the stroke onset, on Eliquis 5 mg twice a day. He has been compliant with the medication, took his last dose this morning at about 8 AM. He is also on aspirin 325 mg daily for the past few years and continues to take aspirin now, along with Eliquis. 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 This morning he woke up with right-sided headache, nausea and generalized fatigue but no other focal neurological symptoms. He was advised to go to the ER by his wife for further evaluation. Initial CT of the head showed a large right parieto-occipital parenchymal hemorrhage. Neurosurgery evaluated the patient in the ER and at this time no surgical intervention is planned. His blood pressure was elevated to 170s systolic has been started on nicardipine drip to maintain goal systolic blood pressure less than 130 to reduce the risk of hemorrhagic expansion. He was also started on Kcentra, after consultation with pharmacy.    SUBJECTIVE (INTERVAL HISTORY) Patient is awake and alert.  No complaints  .  States left ankle has and pain are diminished. Awaiting to go to [place].    OBJECTIVE Temp:  [97.4 F (36.3 C)-98 F (36.7 C)] 98 F (36.7 C) (01/16 1419) Pulse Rate:  [61-98] 98 (01/16 1419) Cardiac Rhythm:  [-] Atrial fibrillation (01/16 0700) Resp:  [15-20] 20 (01/16 1419) BP: (121-151)/(84-102) 129/92 mmHg (01/16 1419) SpO2:  [100 %] 100 % (01/16 1419)  CBC:   Recent Labs Lab 06/09/15 0220  WBC 6.3  HGB 9.4*  HCT 30.2*  MCV 79.1  PLT 299    Basic Metabolic Panel:   Recent Labs Lab 06/09/15 0220  NA 142  K 3.7  CL 102  CO2 29  GLUCOSE 100*  BUN 9   CREATININE 0.86  CALCIUM 9.1    Lipid Panel:     Component Value Date/Time   CHOL 111 05/29/2015 0510   TRIG 72 05/29/2015 0510   HDL 35* 05/29/2015 0510   CHOLHDL 3.2 05/29/2015 0510   VLDL 14 05/29/2015 0510   LDLCALC 62 05/29/2015 0510   HgbA1c:  Lab Results  Component Value Date   HGBA1C 7.6* 05/29/2015   Urine Drug Screen:     Component Value Date/Time   LABOPIA POSITIVE* 05/27/2015 1944   COCAINSCRNUR NONE DETECTED 05/27/2015 1944   LABBENZ NONE DETECTED 05/27/2015 1944   AMPHETMU NONE DETECTED 05/27/2015 1944   THCU NONE DETECTED 05/27/2015 1944   LABBARB NONE DETECTED 05/27/2015 1944      IMAGING   Ct Angio Head and Neck W/cm &/or Wo Cm  05/27/2015   Aborted study due to extravasation of IV contrast into the arm.   Ct Head Wo Contrast 05/27/2015   Right parietal/occipital lobe hemorrhage, likely due to hemorrhagic transformation of a right PCA distribution infarct. Mass effect with extension of hemorrhage into the right lateral ventricle and probable developing hydrocephal  05/28/2015 IMPRESSION: 1. Size stable parenchymal hemorrhage in the posterior right cerebral hemisphere, again suggestive of hemorrhagic PCA territory infarct. Mass effect with 8mm midline shift is stable. 2. Stable intraventricular extension with right temporal horn obstruction. 3. Correlation with reported outside imaging would be useful. Otherwise, surveillance is recommended.  05/30/2015  IMPRESSION: 1. Large right parieto-occipital intraparenchymal hemorrhage has increased mildly in size, measuring 8.4 x 5.0 cm, with mildly worsened surrounding edema. The degree of leftward midline shift is relatively stable, measuring 7-8 mm. 2. Mild dilatation of the lateral ventricles in comparison to the prior study, concerning for mild obstructive hydrocephalus, with new blood noted filling the third ventricle. 3. Mild mass effect about the right cerebral peduncle, without evidence for transtentorial  herniation at this time.     CT Head Wo Contrast 05/31/2015 Hemorrhagic right PCA infarct with intraventricular extension. The volume of hemorrhage and vasogenic edema is stable from yesterday but there is progressive lateral ventriculomegaly with increased leftward midline shift, now 13 mm.   CT Head Wo Contrast 06/01/2015 1. Hemorrhagic right PCA infarct status post interval hematoma evacuation. Residual hemorrhage as above. 2. Decreased midline shift, now 6 mm. 3. Decreased size of the ventricles.   TTE - - Left ventricle: The cavity size was normal. Systolic function was normal. The estimated ejection fraction was in the range of 60% to 65%. Wall motion was normal; there were no regional wall motion abnormalities. The study was not technically sufficient to allow evaluation of LV diastolic dysfunction due to atrial fibrillation. - Aortic valve: There was no regurgitation. - Aortic root: The aortic root was normal in size. - Mitral valve: Structurally normal valve. There was mild regurgitation. - Left atrium: The atrium was mildly dilated. - Right ventricle: The cavity size was normal. Wall thickness was normal. Systolic function was normal. - Right atrium: The atrium was normal in size. - Tricuspid valve: There was mild regurgitation. - Pulmonic valve: There was no regurgitation. - Pulmonary arteries: Systolic pressure was within the normal range. - Inferior vena cava: The vessel was normal in size. - Pericardium, extracardiac: There was no pericardial effusion.     PHYSICAL EXAM  Temp:  [97.4 F (36.3 C)-98 F (36.7 C)] 98 F (36.7 C) (01/16 1419) Pulse Rate:  [61-98] 98 (01/16 1419) Resp:  [15-20] 20 (01/16 1419) BP: (121-151)/(84-102) 129/92 mmHg (01/16 1419) SpO2:  [100 %] 100 % (01/16 1419)  General - Well nourished, well developed, in no acute distress. HEENT:   ; moist mucous membranes; nose and throat clear Cardiovascular - Regular rate and  rhythm. Abd:  Benign Extr:  No  Edema on right; slight swelling of left   Mental Status -  Level of arousal and orientation to time, place, and person were intact. Language including expression, naming, repetition, comprehension was assessed and found intact. Attention span and concentration were normal. Fund of Knowledge was assessed and was intact.  Cranial Nerves II - XII - II - left homonymous hemianopia. III, IV, VI - Extraocular movements intact. V - Facial sensation intact bilaterally. VII - Facial movement intact bilaterally.mild left nasolabial asymmetry VIII - Hearing intact bilaterally. X - Palate elevates symmetrically. XI - Chin turning & shoulder shrug intact bilaterally. XII - Tongue protrusion intact.  Motor Strength - The patient's strength was normal in all extremities and pronator drift was absent.  Bulk was normal and fasciculations were absent.    Motor Tone - Muscle tone was assessed at the neck and appendages and was normal.  Sensory - Light touch, temperature/pinprick were assessed and were symmetrical.    Coordination - The patient had normal movements in the hands with no ataxia or dysmetria.    Gait and Station - not tested.  ASSESSMENT/PLAN [name] is a 60 y.o. 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  He did not receive IV t-PA due to hemorrhagic stroke.  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 May consider resume eliquis in about 4-6 weeks when following up in clinic depending on clinic picture.  Ongoing aggressive stroke risk factor management  Therapy recommendations: SNF Disposition: SNF  Cerebral edema  Midline shift stable on CT head  Now off 3% saline  Na 139 on 06/03/2015    Recent right PCA infarct  With left hemianopia  Likely due to afib on on AC    Afib with RVR on eliquis PTA  Rate 90-115  Put back on home metoprolol and cardizem  Decrease cardizem to 120 due to bradycardia  Hold off eliquis due to ICH  Discontinue ASA as home medication    Hypertension  Malignant hypertension  Off cardene  Put back on home medications.  Now on:  Metoprolol 75mg  BID  Diltiazem XR 120mg  daily  Lisinopril 20mg  daily.  Will change to BID  Hyperlipidemia  Home meds:  No lipid lowering medications prior to admission  LDL 62, goal < 70  Continue statin at discharge  Diabetes  HgbA1c 7.6, goal < 7.0  SSI  Resume home meds with metformin and glipizide  CBG monitoring  Other Stroke Risk Factors  Advanced age  ETOH use  Hx stroke/TIA  Obstructive sleep apnea, on CPAP at home  Other Active Problems  HA - fioricet PRN  Nausea - zofran PRN  Anemia  PLAN  Check labs in a.m.  May consider resume eliquis in about 4-6 weeks when following up in clinic depending on clinic picture.  [facility]  Ordered Miralax to be given today  Hospital day # 14      VASCULAR NEUROLOGY ATTENDING NOTE Patient was seen and examined by me personally. I reviewed notes, independently viewed imaging studies, participated in  medical decision making and plan of care. The laboratory and radiographic studies were personally reviewed by me.  ROS completed by me personally and pertinent positives fully documented.  Assessment and plan completed by me personally:  Continue mobilization out of bed. Physical occupational speech therapy recommend SNF. Patient will likely need to be off anticoagulation for a month at least but will start aspirin today.    Should have Ophthmalogy eval before driving [facility]  Check labs in a.m.  May consider resume eliquis in about 4-6 weeks when following up in clinic depending on clinic picture.  [facility]  Ordered Miralax to be given today   SIGNED BY: Dr. Delia HeadyPramod Amethyst Gainer, MD

## 2015-06-10 NOTE — Progress Notes (Signed)
Nutrition Follow-up  DOCUMENTATION CODES:   Non-severe (moderate) malnutrition in context of chronic illness  INTERVENTION:   Glucerna Shake PO TID, each supplement provides 220 kcal and 10 grams of protein  NUTRITION DIAGNOSIS:   Malnutrition related to chronic illness as evidenced by mild depletion of body fat, mild depletion of muscle mass, percent weight loss.  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 Admitted 1/2 with large right ICH.   1/16 Pt PO intake has been much better. Averaging 95-100% over past 4 meals. No acute complaints at this time. No N/V/D/C. Patient awaiting transfer to [facility], supposed to transfer tomorrow.   1/9 Patient reports that his appetite is "not what it used to be." He says that he ate breakfast this AM, but he did not realize that his lunch tray was in his room. He likes Glucerna supplements and agreed to drink them between meals to ensure adequate intake.   Diet Order:  Diet Carb Modified Fluid consistency:: Thin; Room service appropriate?: Yes  Skin:  Reviewed, no issues  Last BM:  1/6  Height:   Ht Readings from Last 1 Encounters:  06/02/15 6' 2.5" (1.892 m)    Weight:   Wt Readings from Last 1 Encounters:  06/02/15 213 lb 10 oz (96.9 kg)    Ideal Body Weight:  89 kg  BMI:  Body mass index is 27.07 kg/(m^2).  Estimated Nutritional Needs:   Kcal:  2200-2400  Protein:  115-130 grams  Fluid:  > 2.2 L/day  EDUCATION NEEDS:   No education needs identified at this time  Satira Anis. Idabell Picking, MS, RD LDN After Hours/Weekend Pager [phone]

## 2015-06-10 NOTE — Progress Notes (Signed)
RT note: Pt. placed on CPAP for h/s, tolerating well, Rt to monitor.

## 2015-06-10 NOTE — Clinical Social Work Note (Addendum)
[facility] 8[facility] on Tuesday [facility]  Nurse please call report to [phone] if this does not work try ext [phone].  CSW to continue to follow patient's progress throughout discharge planning CSW to [place] [facility] at [phone] Tuesday morning.  Ervin KnackEric R. Arnita Koons, MSW, Theresia MajorsLCSWA [phone] 06/10/2015 6:42 PM

## 2015-06-11 LAB — GLUCOSE, CAPILLARY: Glucose-Capillary: 89 mg/dL (ref 65–99)

## 2015-06-11 MED ORDER — ASPIRIN 81 MG PO CHEW: 81.0000 mg | CHEWABLE_TABLET | Freq: Every day | ORAL | Status: AC

## 2015-06-11 MED ORDER — GLUCERNA SHAKE PO LIQD: 237.0000 mL | Freq: Three times a day (TID) | ORAL | Status: AC

## 2015-06-11 MED ORDER — LEVETIRACETAM 500 MG PO TABS: 500.0000 mg | ORAL_TABLET | Freq: Two times a day (BID) | ORAL | Status: AC

## 2015-06-11 MED ORDER — DOCUSATE SODIUM 100 MG PO CAPS: 100.0000 mg | ORAL_CAPSULE | Freq: Two times a day (BID) | ORAL | Status: AC

## 2015-06-11 MED ORDER — LISINOPRIL 20 MG PO TABS: 20.0000 mg | ORAL_TABLET | Freq: Two times a day (BID) | ORAL | Status: AC

## 2015-06-11 NOTE — Clinical Social Work Note (Signed)
Clinical Social Worker facilitated patient discharge including contacting patient family and facility to confirm patient discharge plans.  Clinical information faxed to facility and family agreeable with plan.  CSW arranged ambulance transport via [facility] to [employer].  RN to call report prior to discharge.  Clinical Social Worker will sign off for now as social work intervention is no longer needed. Please consult [place] again if new need arises.  Derenda Fennel, MSW, LCSWA [phone] 06/11/2015 10:32 AM

## 2015-06-11 NOTE — Clinical Social Work Placement (Signed)
   CLINICAL SOCIAL WORK PLACEMENT  NOTE  Date:  06/11/2015  Patient Details  Name: [name] MRN: [id number] Date of Birth: 1955/07/04  Clinical Social Work is seeking post-discharge placement for this patient at the Skilled  Nursing Facility level of care (*CSW will initial, date and re-position this form in  chart as items are completed):  Yes   Patient/family provided with [place] Clinical Social Work Department's list of facilities offering this level of care within the geographic area requested by the patient (or if unable, by the patient's family).  Yes   Patient/family informed of their freedom to choose among providers that offer the needed level of care, that participate in Medicare, Medicaid or managed care program needed by the patient, have an available bed and are willing to accept the patient.  Yes   Patient/family informed of [place]'s ownership interest in [facility] and [facility], as well as of the fact that they are under no obligation to receive care at these facilities.  PASRR submitted to EDS on 06/05/15     PASRR number received on 06/05/15     Existing PASRR number confirmed on       FL2 transmitted to all facilities in geographic area requested by pt/family on 06/05/15     FL2 transmitted to all facilities within larger geographic area on 06/05/15     Patient informed that his/her managed care company has contracts with or will negotiate with certain facilities, including the following:        Yes   Patient/family informed of bed offers received.  Patient chooses bed at  [facility] )     Physician recommends and patient chooses bed at      [facility] to [facility] to  [facility] ) on 06/11/15.  Patient to be transferred to facility by  [facility] )     Patient family notified on 06/11/15 of transfer.  Name of family member notified:   (Pt's wife, Sherry Ruffing)      PHYSICIAN Please sign FL2, Please prepare priority discharge summary, including medications     Additional Comment:    _______________________________________________ Derenda Fennel, MSW, LCSWA [phone] 06/11/2015 10:31 AM

## 2015-06-11 NOTE — Progress Notes (Signed)
Pt d/c to [facility] by ambulance. Assessment stable. Report called to [place] at [place].

## 2015-06-12 DIAGNOSIS — I619 Nontraumatic intracerebral hemorrhage, unspecified: Secondary | ICD-10-CM | POA: Insufficient documentation

## 2015-11-30 DIAGNOSIS — R001 Bradycardia, unspecified: Secondary | ICD-10-CM | POA: Insufficient documentation

## 2015-11-30 DIAGNOSIS — R112 Nausea with vomiting, unspecified: Secondary | ICD-10-CM | POA: Insufficient documentation

## 2016-10-12 IMAGING — CR DG ANKLE PORT 2V*L*
2 series · 2 of 2 positions shown · non-contrast
Comparison: No priors.

CLINICAL DATA: 59-year-old male with left-sided ankle pain,
aggravated by walking.

EXAM:
PORTABLE LEFT ANKLE - 2 VIEW

[AP]
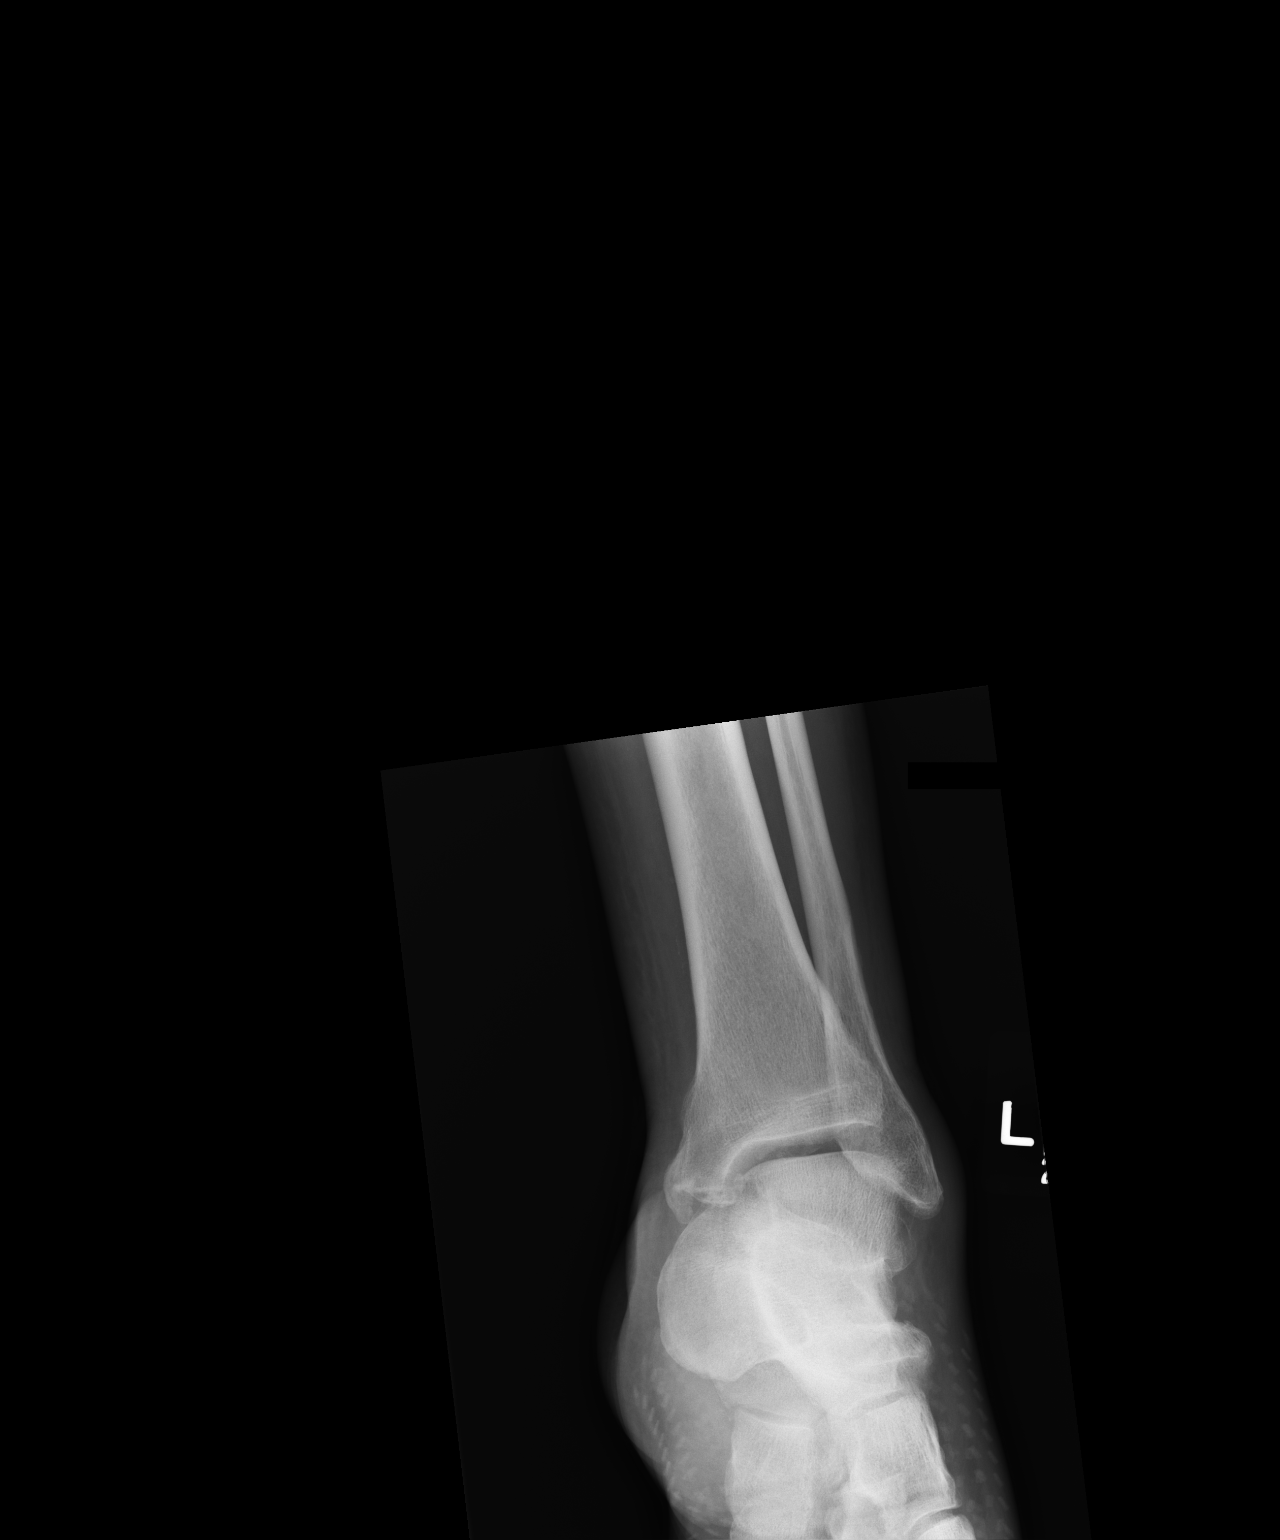

[lateral]
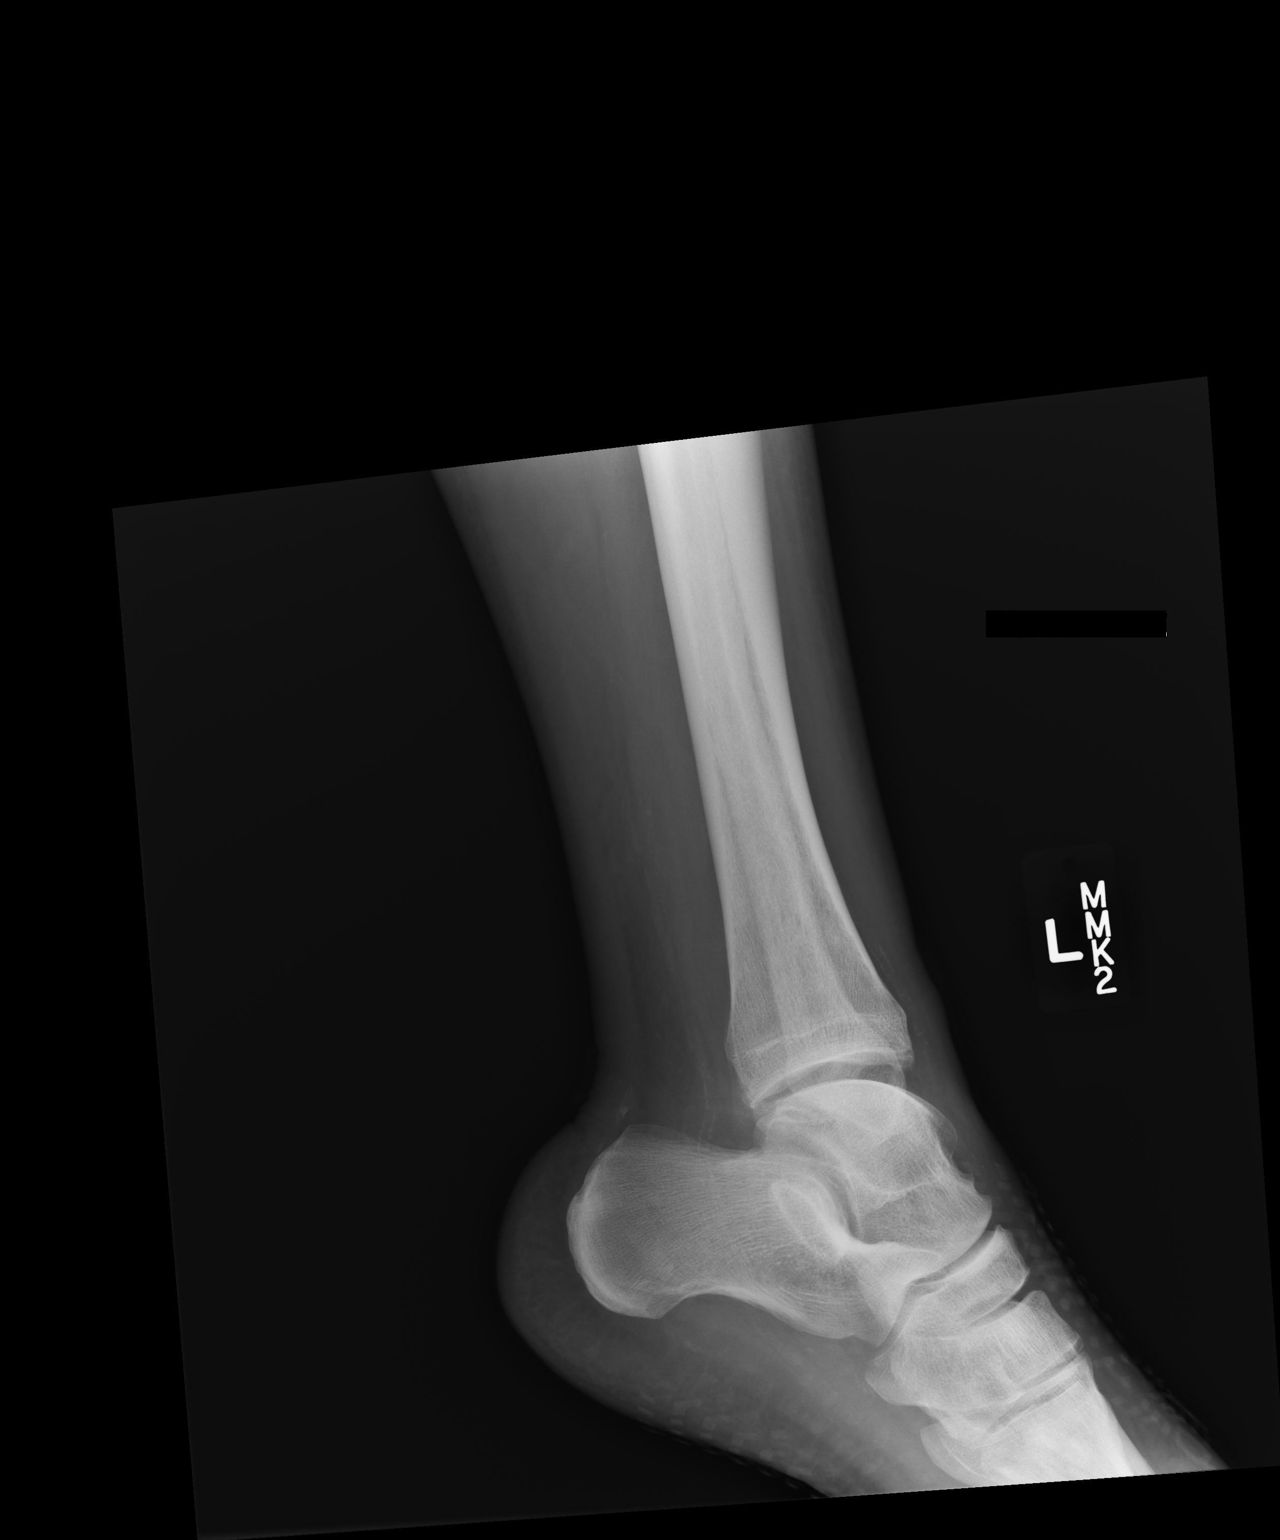

[2 of 2 positions shown; findings below may reference images not displayed]

FINDINGS: Two views of the left ankle demonstrate no acute displaced fracture,
subluxation or dislocation. There is joint space narrowing,
subchondral sclerosis and osteophyte formation at the tibiotalar
joint, compatible with mild to moderate osteoarthritis. Irregularity
of the medial malleolus, likely related to remote trauma.
IMPRESSION: 1. No acute radiographic abnormality of the left ankle.

## 2017-11-04 ENCOUNTER — Other Ambulatory Visit: Payer: Self-pay | Admitting: Internal Medicine

## 2017-11-04 DIAGNOSIS — R1011 Right upper quadrant pain: Secondary | ICD-10-CM

## 2017-11-18 ENCOUNTER — Ambulatory Visit
Admission: RE | Admit: 2017-11-18 | Discharge: 2017-11-18 | Disposition: A | Discharge status: Home / Self Care | Payer: Non-veteran care | Source: Ambulatory Visit | Attending: Internal Medicine | Admitting: Internal Medicine

## 2017-11-18 DIAGNOSIS — R1011 Right upper quadrant pain: Secondary | ICD-10-CM

## 2018-11-26 IMAGING — US US ABDOMEN LIMITED
1 series · 14 of 25 positions shown · non-contrast
Comparison: None.

CLINICAL DATA: Right upper quadrant pain for 1 month

EXAM:
ULTRASOUND ABDOMEN LIMITED RIGHT UPPER QUADRANT

[Series 1: us abdomen limited · 0.25mm/px · 14 of 48 slices shown]
[im 1/48]
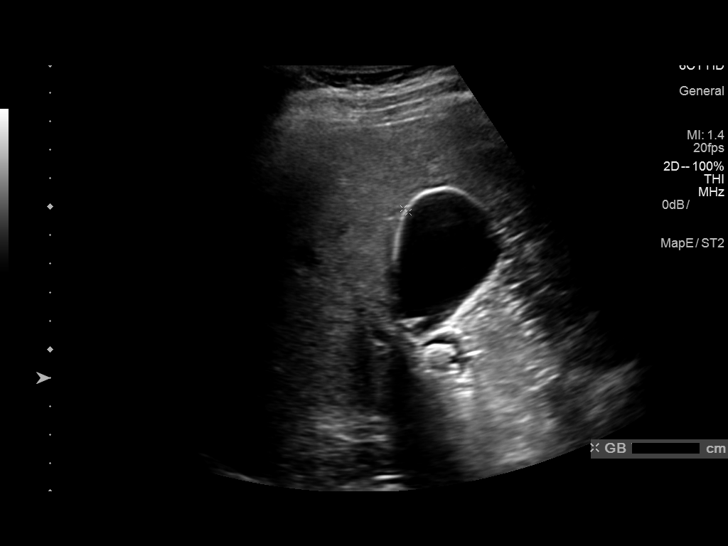
[im 4/48]
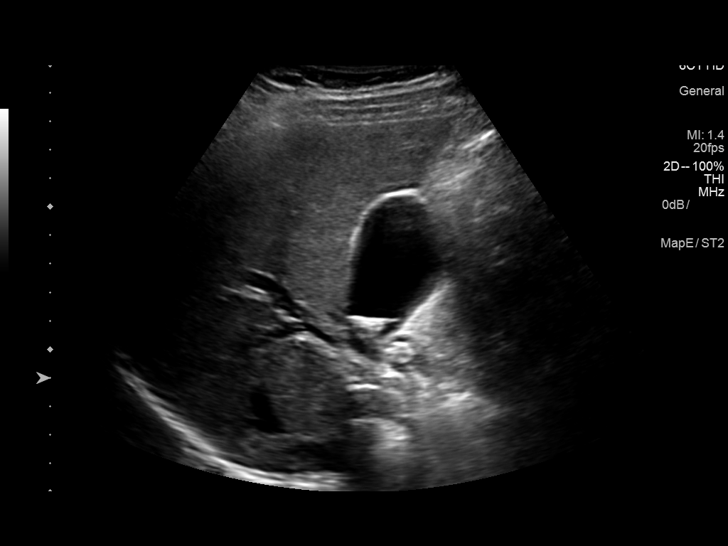
[im 8/48]
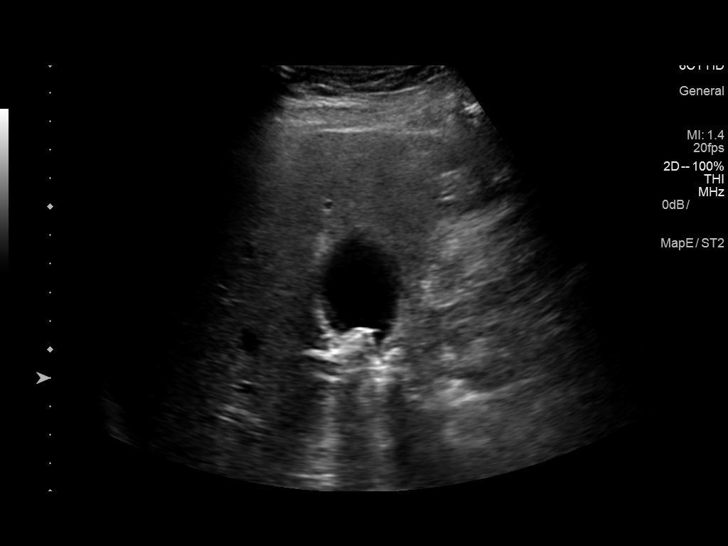
[im 12/48]
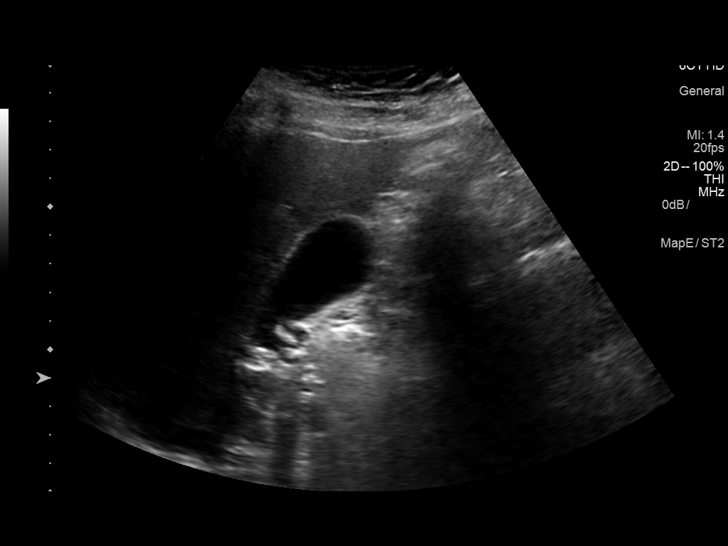
[im 16/48]
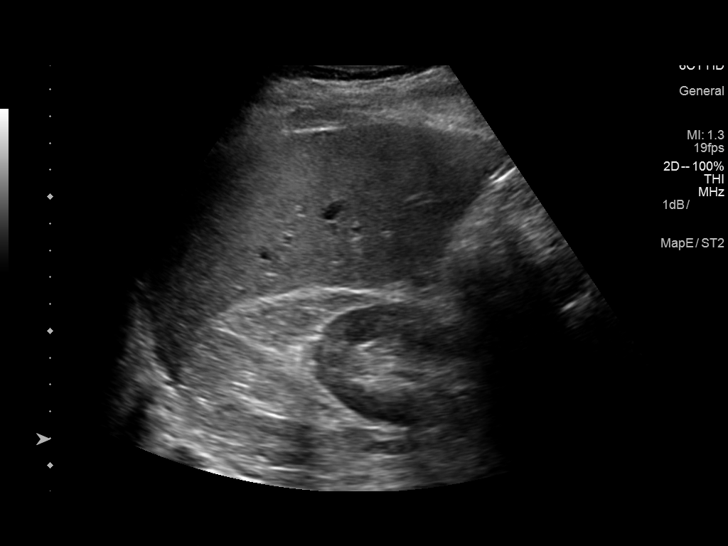
[im 18/48]
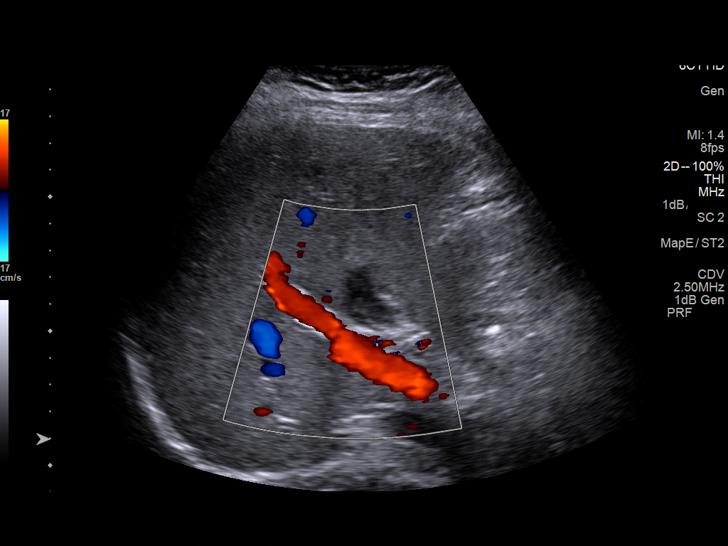
[im 22/48]
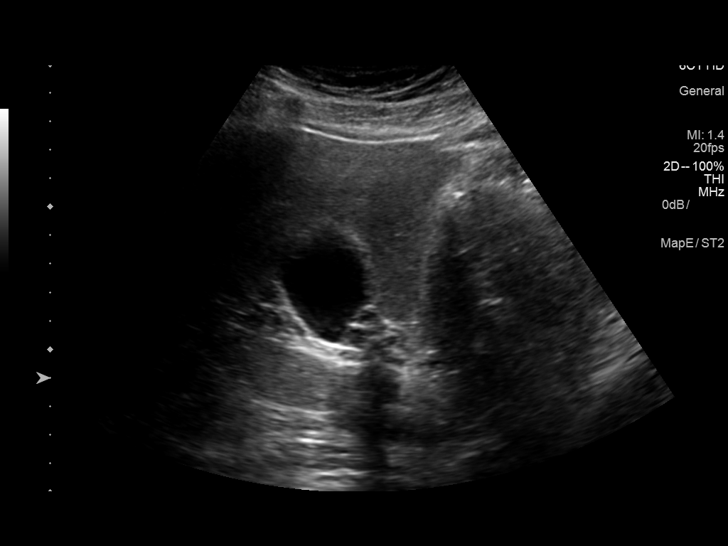
[im 26/48]
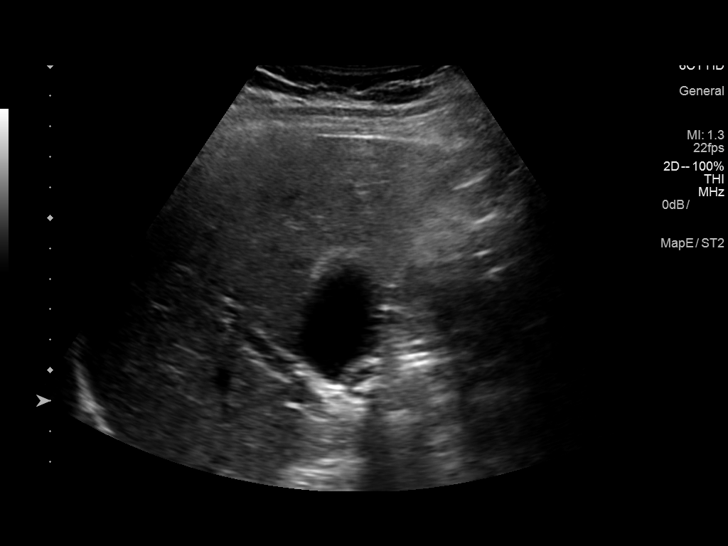
[im 30/48]
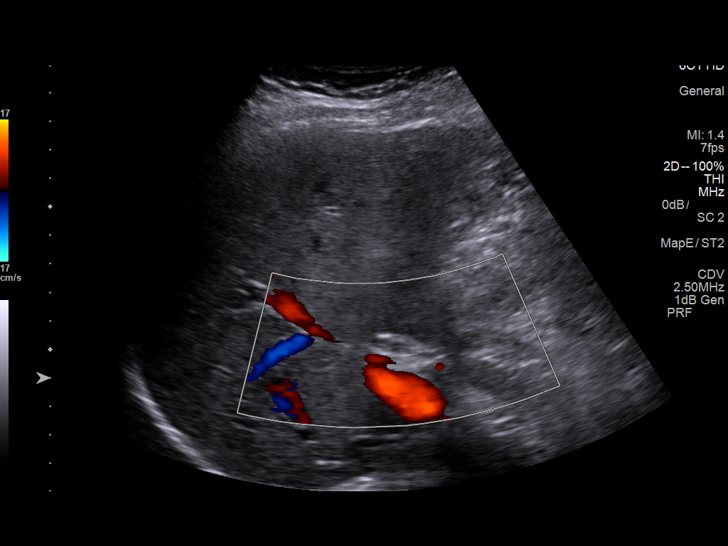
[im 32/48]
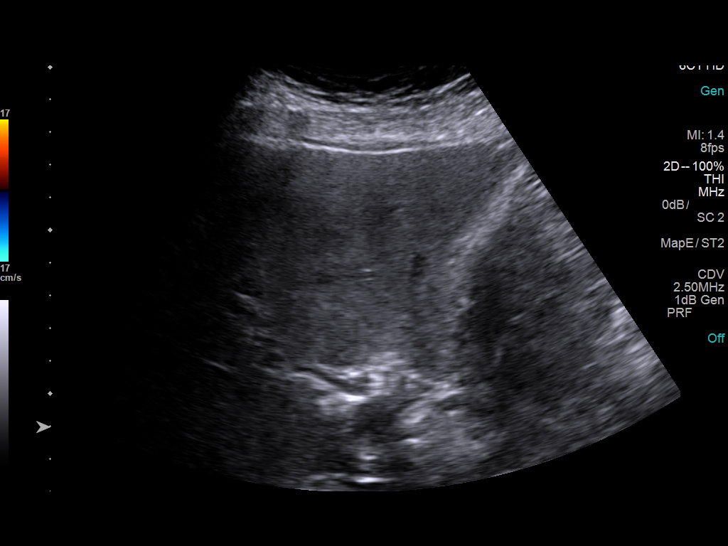
[im 36/48]
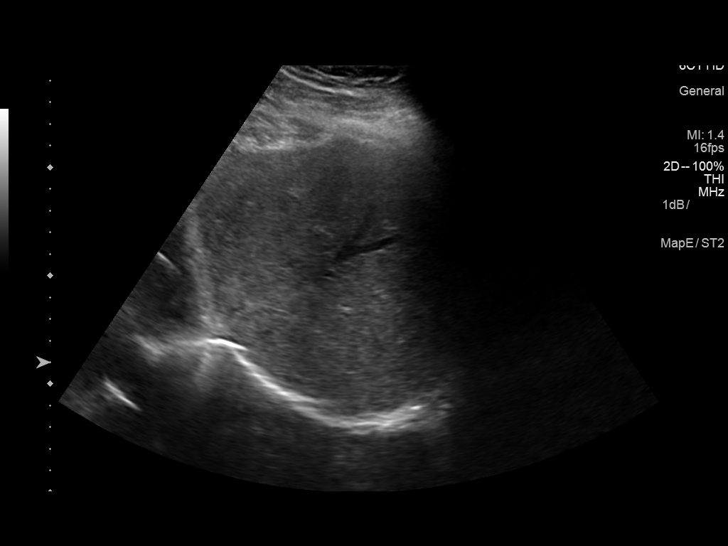
[im 40/48]
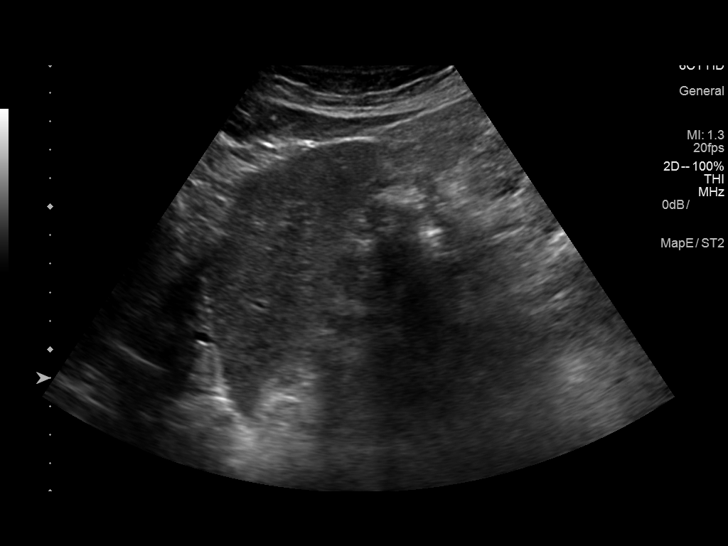
[im 44/48]
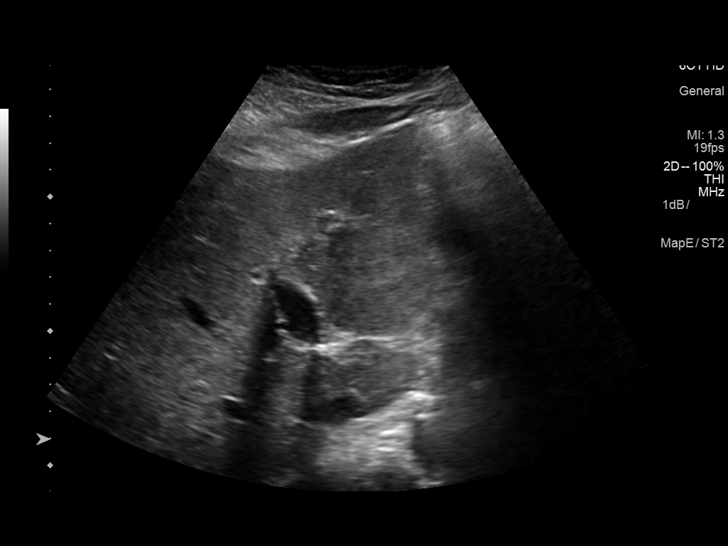
[im 48/48]
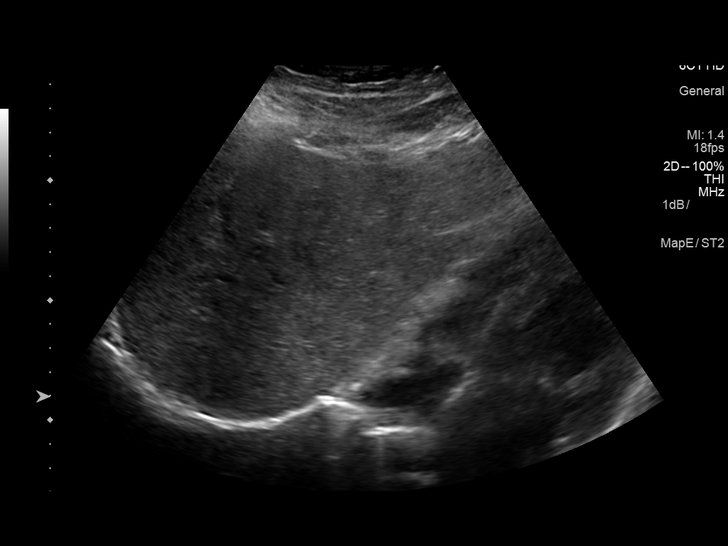

[14 of 25 positions shown; findings below may reference images not displayed]

FINDINGS: Gallbladder:

Gallbladder is well distended. Multiple gallstones are noted within.
No gallbladder wall thickening or pericholecystic fluid is noted.

Common bile duct:

Diameter: 6 mm.

Liver:

No focal lesion identified. Within normal limits in parenchymal
echogenicity. Portal vein is patent on color Doppler imaging with
normal direction of blood flow towards the liver.
IMPRESSION: Cholelithiasis without complicating factors.

## 2019-06-14 ENCOUNTER — Ambulatory Visit: Payer: Medicare PPO | Attending: Internal Medicine

## 2019-06-14 DIAGNOSIS — Z23 Encounter for immunization: Secondary | ICD-10-CM

## 2019-06-14 NOTE — Progress Notes (Signed)
   Covid-19 Vaccination Clinic  Name:  [name]    MRN: [id number] DOB: 1956-01-19  06/14/2019  [name] was observed post Covid-19 immunization for 15 minutes without incidence. He was provided with Vaccine Information Sheet and instruction to access the V-Safe system.   [name] was instructed to call 911 with any severe reactions post vaccine: Marland Kitchen Difficulty breathing  . Swelling of your face and throat  . A fast heartbeat  . A bad rash all over your body  . Dizziness and weakness    Immunizations Administered    Name Date Dose VIS Date Route   Pfizer COVID-19 Vaccine 06/14/2019  5:50 PM 0.3 mL 05/05/2019 Intramuscular   Manufacturer: [employer], [employer]   Lot: [id number]   NDC: [address]-3

## 2019-07-06 ENCOUNTER — Ambulatory Visit: Payer: Medicare PPO | Attending: Internal Medicine

## 2019-07-06 DIAGNOSIS — Z23 Encounter for immunization: Secondary | ICD-10-CM | POA: Insufficient documentation

## 2019-07-06 NOTE — Progress Notes (Signed)
   Covid-19 Vaccination Clinic  Name:  [name]    MRN: [id number] DOB: August 09, 1955  07/06/2019  Mr. Thrush was observed post Covid-19 immunization for 15 minutes without incidence. He was provided with Vaccine Information Sheet and instruction to access the V-Safe system.   [name] was instructed to call 911 with any severe reactions post vaccine: Marland Kitchen Difficulty breathing  . Swelling of your face and throat  . A fast heartbeat  . A bad rash all over your body  . Dizziness and weakness    Immunizations Administered    Name Date Dose VIS Date Route   Pfizer COVID-19 Vaccine 07/06/2019  8:38 AM 0.3 mL 05/05/2019 Intramuscular   Manufacturer: [employer], [employer]   Lot: [id number]   NDC: [address]-4

## 2022-01-03 ENCOUNTER — Emergency Department (HOSPITAL_BASED_OUTPATIENT_CLINIC_OR_DEPARTMENT_OTHER): Payer: No Typology Code available for payment source | Admitting: Radiology

## 2022-01-03 ENCOUNTER — Encounter (HOSPITAL_BASED_OUTPATIENT_CLINIC_OR_DEPARTMENT_OTHER): Payer: Self-pay

## 2022-01-03 ENCOUNTER — Emergency Department (HOSPITAL_BASED_OUTPATIENT_CLINIC_OR_DEPARTMENT_OTHER): Payer: No Typology Code available for payment source

## 2022-01-03 ENCOUNTER — Other Ambulatory Visit: Payer: Self-pay

## 2022-01-03 ENCOUNTER — Emergency Department (HOSPITAL_BASED_OUTPATIENT_CLINIC_OR_DEPARTMENT_OTHER)
Admission: EM | Admit: 2022-01-03 | Discharge: 2022-01-03 | Disposition: A | Discharge status: Home / Self Care | Payer: No Typology Code available for payment source | Attending: Emergency Medicine | Admitting: Emergency Medicine

## 2022-01-03 DIAGNOSIS — S0990XA Unspecified injury of head, initial encounter: Secondary | ICD-10-CM

## 2022-01-03 DIAGNOSIS — S0001XA Abrasion of scalp, initial encounter: Secondary | ICD-10-CM | POA: Diagnosis not present

## 2022-01-03 DIAGNOSIS — Z7984 Long term (current) use of oral hypoglycemic drugs: Secondary | ICD-10-CM | POA: Insufficient documentation

## 2022-01-03 DIAGNOSIS — S80212A Abrasion, left knee, initial encounter: Secondary | ICD-10-CM

## 2022-01-03 DIAGNOSIS — Z79899 Other long term (current) drug therapy: Secondary | ICD-10-CM | POA: Insufficient documentation

## 2022-01-03 DIAGNOSIS — F0390 Unspecified dementia without behavioral disturbance: Secondary | ICD-10-CM | POA: Diagnosis not present

## 2022-01-03 DIAGNOSIS — W19XXXA Unspecified fall, initial encounter: Secondary | ICD-10-CM | POA: Diagnosis not present

## 2022-01-03 DIAGNOSIS — Z7982 Long term (current) use of aspirin: Secondary | ICD-10-CM | POA: Diagnosis not present

## 2022-01-03 LAB — CBC
HCT: 44.1 % (ref 39.0–52.0)
Hemoglobin: 13.8 g/dL (ref 13.0–17.0)
MCH: 26.1 pg (ref 26.0–34.0)
MCHC: 31.3 g/dL (ref 30.0–36.0)
MCV: 83.4 fL (ref 80.0–100.0)
Platelets: 255 10*3/uL (ref 150–400)
RBC: 5.29 MIL/uL (ref 4.22–5.81)
RDW: 14 % (ref 11.5–15.5)
WBC: 7.2 10*3/uL (ref 4.0–10.5)
nRBC: 0 % (ref 0.0–0.2)

## 2022-01-03 LAB — BASIC METABOLIC PANEL
Anion gap: 12 (ref 5–15)
BUN: 17 mg/dL (ref 8–23)
CO2: 25 mmol/L (ref 22–32)
Calcium: 9.7 mg/dL (ref 8.9–10.3)
Chloride: 102 mmol/L (ref 98–111)
Creatinine, Ser: 1.21 mg/dL (ref 0.61–1.24)
GFR, Estimated: >60 mL/min (ref >60)
Glucose, Bld: 82 mg/dL (ref 70–99)
Potassium: 4.5 mmol/L (ref 3.5–5.1)
Sodium: 139 mmol/L (ref 135–145)

## 2022-01-03 NOTE — ED Triage Notes (Signed)
Pt states he fell yesterday evening. States he does not remember falling. Does not remember if he lost consciousness. States he is on blood thinners. State he feels like he was in a-fib. Hit his left side parietal area, and left knee.

## 2022-01-03 NOTE — ED Notes (Addendum)
Pt reports A fib since 7/20, has pacemaker and was called for cardiac event. Reports dizziness and blurred vision since episode. Fall yesterday, unwitnessed, pt does not recall fall. Hit left side of head, wife reports head was bleeding. No bleeding upon assessment today. Pt denies headache or any new symptoms since fall. On coumadin.

## 2022-01-03 NOTE — ED Provider Notes (Signed)
[facility] EMERGENCY DEPT Provider Note   CSN: [id number] Arrival date & time: 01/03/22  0932     History  Chief Complaint  Patient presents with   [name] is a 66 y.o. male.  Level 5 caveat secondary to dementia.  Patient has a history of A-fib and has been in A-fib with an elevated heart rate since July.  He is on Eliquis and is supposed to get a cardioversion done later this month.  He had an unwitnessed fall last evening.  He does not recall the fall.  Family noticed blood on the side of his head today.  He endorses a mild headache.  No chest pain or shortness of breath.  He said he has been dizzy and having some blurry vision since he went back into A-fib last month.  No abdominal pain vomiting diarrhea.  He also noticed an abrasion on his left knee.  He has been ambulating at baseline, has a history of a brain bleed.  He thinks he had his last tetanus shot less than 10 years ago.  The history is provided by the patient and the spouse.  Fall This is a new problem. The current episode started yesterday. The problem has been resolved. Associated symptoms include headaches. Pertinent negatives include no chest pain, no abdominal pain and no shortness of breath. Nothing aggravates the symptoms. Nothing relieves the symptoms. He has tried rest for the symptoms. The treatment provided mild relief.       Home Medications Prior to Admission medications   Medication Sig Start Date End Date Taking? Authorizing Provider  aspirin 81 MG chewable tablet Chew 1 tablet (81 mg total) by mouth daily. 06/11/15   Layne Benton, NP  diltiazem (DILACOR XR) 120 MG 24 hr capsule Take 240 mg by mouth daily.     [provider]  docusate sodium (COLACE) 100 MG capsule Take 1 capsule (100 mg total) by mouth 2 (two) times daily. 06/11/15   Layne Benton, NP  feeding supplement, GLUCERNA SHAKE, (GLUCERNA SHAKE) LIQD Take 237 mLs by mouth 3 (three) times daily between meals.  06/11/15   Layne Benton, NP  gabapentin (NEURONTIN) 600 MG tablet Take 600 mg by mouth 4 (four) times daily.    [provider]  glipiZIDE (GLUCOTROL) 10 MG tablet Take 10 mg by mouth daily before breakfast.    [provider]  HYDROcodone-acetaminophen (NORCO) 10-325 MG tablet Take 1 tablet by mouth every 6 (six) hours as needed for severe pain.    [provider]  levETIRAcetam (KEPPRA) 500 MG tablet Take 1 tablet (500 mg total) by mouth 2 (two) times daily. 06/11/15   Layne Benton, NP  lisinopril (PRINIVIL,ZESTRIL) 20 MG tablet Take 1 tablet (20 mg total) by mouth 2 (two) times daily. 06/11/15   Layne Benton, NP  metFORMIN (GLUCOPHAGE) 850 MG tablet Take 850 mg by mouth 2 (two) times daily with a meal.    [provider]  metoprolol (LOPRESSOR) 50 MG tablet Take 75 mg by mouth 2 (two) times daily.    [provider]  omeprazole (PRILOSEC) 20 MG capsule Take 20 mg by mouth daily.    [provider]      Allergies    Penicillins and Blood-group specific substance    Review of Systems   Review of Systems  Constitutional:  Negative for fever.  Eyes:  Positive for visual disturbance.  Respiratory:  Negative for shortness of  breath.   Cardiovascular:  Negative for chest pain.  Gastrointestinal:  Negative for abdominal pain.  Musculoskeletal:  Negative for neck pain.  Skin:  Positive for wound.  Neurological:  Positive for headaches.    Physical Exam Updated Vital Signs BP (!) 135/97 (BP Location: Right Arm)   Pulse (!) 116   Temp 99.4 F (37.4 C) (Oral)   Resp 20   Ht 6\' 2"  (1.88 m)   Wt 102.1 kg   SpO2 100%   BMI 28.89 kg/m  Physical Exam Vitals and nursing note reviewed.  Constitutional:      General: He is not in acute distress.    Appearance: Normal appearance. He is well-developed.  HENT:     Head: Normocephalic.     Comments: He has abrasions on his left temporal and parietal area. Eyes:     Conjunctiva/sclera:  Conjunctivae normal.  Cardiovascular:     Rate and Rhythm: Tachycardia present. Rhythm irregular.     Heart sounds: No murmur heard. Pulmonary:     Effort: Pulmonary effort is normal. No respiratory distress.     Breath sounds: Normal breath sounds.  Abdominal:     Palpations: Abdomen is soft.     Tenderness: There is no abdominal tenderness. There is no guarding or rebound.  Musculoskeletal:        General: No tenderness or deformity.     Cervical back: Neck supple. No tenderness.     Comments: He has bilateral knee replacement scars.  There is an abrasion over his left patella.  Nontender full range of motion.  Skin:    General: Skin is warm and dry.     Capillary Refill: Capillary refill takes less than 2 seconds.  Neurological:     General: No focal deficit present.     Mental Status: He is alert. Mental status is at baseline.     ED Results / Procedures / Treatments   Labs (all labs ordered are listed, but only abnormal results are displayed) Labs Reviewed  CBC  BASIC METABOLIC PANEL    EKG EKG Interpretation  Date/Time:  Saturday January 03 2022 09:41:42 EDT Ventricular Rate:  117 PR Interval:    QRS Duration: 82 QT Interval:  318 QTC Calculation: 443 R Axis:   12 Text Interpretation: Atrial flutter with variable A-V block with occasional ventricular-paced complexes and with premature ventricular or aberrantly conducted complexes Anterior infarct , age undetermined Abnormal ECG When compared with ECG of 27-May-2015 15:51, increased PVC and pacer beats now Confirmed by 29-May-2015 [phone]) on 01/03/2022 9:43:57 AM  Radiology CT Head Wo Contrast  Result Date: 01/03/2022 CLINICAL DATA:  Fall yesterday. Patient is amnestic to the event. EXAM: CT HEAD WITHOUT CONTRAST TECHNIQUE: Contiguous axial images were obtained from the base of the skull through the vertex without intravenous contrast. RADIATION DOSE REDUCTION: This exam was performed according to the departmental  dose-optimization program which includes automated exposure control, adjustment of the mA and/or kV according to patient size and/or use of iterative reconstruction technique. COMPARISON:  CT head 06/01/2015. MR head 07/26/2015. 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 Moderate scattered subcortical white matter disease has progressed. No acute cortical infarct is present. Basal ganglia are intact. The ventricles are of normal size. No significant extraaxial fluid collection is present. Insert normal brainstem Vascular: Mild atherosclerotic changes are present within the cavernous internal carotid arteries. No hyperdense  vessel is present. Skull: Right occipital craniotomy noted. Calvarium otherwise intact. Mild soft tissue swelling is present near the vertex posteriorly. No acute fracture is present. Sinuses/Orbits: The paranasal sinuses and mastoid air cells are clear. The globes and orbits are within normal limits. IMPRESSION: 1. Mild soft tissue swelling near the vertex posteriorly without underlying fracture. 2. 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 3. Progressive subcortical white matter disease. This likely reflects the sequela of chronic microvascular ischemia. 4. No acute intracranial abnormality. Electronically Signed   By: Marin Roberts M.D.   On: 01/03/2022 11:07   DG Knee Complete 4 Views Left  Result Date: 01/03/2022 CLINICAL DATA:  fall, pain EXAM: LEFT KNEE - COMPLETE 4+ VIEW COMPARISON:  Oct 15, 2015 FINDINGS: Again seen are the left total knee prosthetic changes and the prosthetic components are in near anatomical alignment. No fracture, subluxation or joint effusion. There are couple of bony fragments seen at the posterior aspect of the knee joint and are new of likely loose bodies. There are some degenerative changes seen  with prominent bony spurring at the inferior aspect of the patella. Soft tissues are unremarkable. IMPRESSION: Status post left knee prosthesis and the prosthetic components are in near anatomical alignment. No fracture, subluxation or joint effusion. Likely loose bodies at the posterior aspect of the knee joint. Electronically Signed   By: Marjo Bicker M.D.   On: 01/03/2022 11:00    Procedures Procedures    Medications Ordered in ED Medications - No data to display  ED Course/ Medical Decision Making/ A&P Clinical Course as of 01/03/22 1725  Sat Jan 03, 2022  1057 X-ray of patient's left knee does not show any acute fracture or hardware displacement.  Head CT does not show any acute bleed.  Awaiting radiology reading. [MB]    Clinical Course User Index [MB] Terrilee Files, MD                           Medical Decision Making Amount and/or Complexity of Data Reviewed Labs: ordered. Radiology: ordered.  This patient complains of fall head injury left knee injury; this involves an extensive number of treatment Options and is a complaint that carries with it a high risk of complications and morbidity. The differential includes fall, contusion, abrasion, intracranial injury, bleed, fracture  I ordered, reviewed and interpreted labs, which included CBC and chemistries normal  I ordered imaging studies which included head CT and left knee x-ray and I independently    visualized and interpreted imaging which showed no significant traumatic findings Additional history obtained from patient's wife Previous records obtained and reviewed in epic no recent admissions  Cardiac monitoring reviewed, A-fib with frequent PVCs Social determinants considered, no significant barriers Critical Interventions: None  After the interventions stated above, I reevaluated the patient and found patient to be asymptomatic watching TV in no distress Admission and further testing considered, no  indications for admission or further work-up at this time.  Reviewed results of work-up with patient and wife.  They are comfortable plan for outpatient follow-up with PCP.  Return instructions discussed          Final Clinical Impression(s) / ED Diagnoses Final diagnoses:  Fall, initial encounter  Injury of head, initial encounter  Abrasion of scalp, initial encounter  Abrasion of left knee, initial encounter    Rx / DC Orders ED Discharge Orders     None  Terrilee Files, MD 01/03/22 1728

## 2022-01-03 NOTE — ED Notes (Signed)
Patient verbalizes understanding of discharge instructions. Opportunity for questioning and answers were provided. Patient discharged from ED.  °

## 2022-01-03 NOTE — Discharge Instructions (Signed)
You were seen in the emergency department for evaluation of injuries from a fall.  You had a CAT scan of your head and x-rays of your left knee.  These did not show any acute traumatic findings.  Your lab work was also unremarkable.  You can use soap and water for your wounds and ice to the area.  Tylenol as needed for pain.  Follow-up with your regular doctor.  Return if any signs of infection or other concerns.

## 2022-03-26 ENCOUNTER — Other Ambulatory Visit (HOSPITAL_BASED_OUTPATIENT_CLINIC_OR_DEPARTMENT_OTHER): Payer: Self-pay | Admitting: Internal Medicine

## 2022-03-26 DIAGNOSIS — Z8673 Personal history of transient ischemic attack (TIA), and cerebral infarction without residual deficits: Secondary | ICD-10-CM

## 2022-03-30 ENCOUNTER — Ambulatory Visit (HOSPITAL_BASED_OUTPATIENT_CLINIC_OR_DEPARTMENT_OTHER)
Admission: RE | Admit: 2022-03-30 | Discharge: 2022-03-30 | Disposition: A | Discharge status: Home / Self Care | Payer: No Typology Code available for payment source | Source: Ambulatory Visit | Attending: Internal Medicine | Admitting: Internal Medicine

## 2022-03-30 DIAGNOSIS — Z8673 Personal history of transient ischemic attack (TIA), and cerebral infarction without residual deficits: Secondary | ICD-10-CM | POA: Diagnosis present
